# Patient Record
Sex: Male | Born: 1975 | Race: White | Hispanic: No | Marital: Single | Smoking: Current every day smoker
Health system: Southern US, Community
[De-identification: ages and names within clinical notes are randomized; demographics above are authoritative.]

## PROBLEM LIST (undated history)

## (undated) DIAGNOSIS — I219 Acute myocardial infarction, unspecified: Secondary | ICD-10-CM

## (undated) DIAGNOSIS — I10 Essential (primary) hypertension: Secondary | ICD-10-CM

## (undated) HISTORY — PX: CORONARY ANGIOPLASTY WITH STENT PLACEMENT: SHX49

---

## 1998-09-20 ENCOUNTER — Emergency Department (HOSPITAL_COMMUNITY): Admission: EM | Admit: 1998-09-20 | Discharge: 1998-09-20 | Payer: Self-pay | Admitting: Emergency Medicine

## 1999-10-06 ENCOUNTER — Emergency Department (HOSPITAL_COMMUNITY): Admission: EM | Admit: 1999-10-06 | Discharge: 1999-10-06 | Payer: Self-pay | Admitting: Emergency Medicine

## 2001-04-03 ENCOUNTER — Emergency Department (HOSPITAL_COMMUNITY): Admission: EM | Admit: 2001-04-03 | Discharge: 2001-04-03 | Payer: Self-pay | Admitting: Emergency Medicine

## 2017-11-19 ENCOUNTER — Other Ambulatory Visit: Payer: Self-pay | Admitting: Student

## 2017-11-19 DIAGNOSIS — H05012 Cellulitis of left orbit: Secondary | ICD-10-CM

## 2017-11-20 ENCOUNTER — Other Ambulatory Visit: Payer: Self-pay | Admitting: Student

## 2017-11-20 DIAGNOSIS — H05012 Cellulitis of left orbit: Secondary | ICD-10-CM

## 2017-11-21 ENCOUNTER — Ambulatory Visit: Admission: RE | Admit: 2017-11-21 | Payer: Self-pay | Source: Ambulatory Visit

## 2017-11-24 ENCOUNTER — Inpatient Hospital Stay (HOSPITAL_COMMUNITY)
Admission: EM | Admit: 2017-11-24 | Discharge: 2017-12-03 | DRG: 853 | Disposition: A | Discharge status: Home / Self Care | Payer: Self-pay | Attending: Internal Medicine | Admitting: Internal Medicine

## 2017-11-24 ENCOUNTER — Inpatient Hospital Stay (HOSPITAL_COMMUNITY)
Admission: EM | Disposition: A | Discharge status: Home / Self Care | Payer: Self-pay | Source: Home / Self Care | Attending: Pulmonary Disease

## 2017-11-24 ENCOUNTER — Encounter (HOSPITAL_COMMUNITY): Payer: Self-pay | Admitting: Certified Registered"

## 2017-11-24 ENCOUNTER — Inpatient Hospital Stay (HOSPITAL_COMMUNITY): Payer: Self-pay

## 2017-11-24 ENCOUNTER — Emergency Department (HOSPITAL_COMMUNITY): Payer: Self-pay

## 2017-11-24 ENCOUNTER — Encounter (HOSPITAL_COMMUNITY): Payer: Self-pay | Admitting: Radiology

## 2017-11-24 ENCOUNTER — Encounter (HOSPITAL_COMMUNITY)
Admission: EM | Disposition: A | Discharge status: Home / Self Care | Payer: Self-pay | Source: Home / Self Care | Attending: Pulmonary Disease

## 2017-11-24 ENCOUNTER — Inpatient Hospital Stay (HOSPITAL_COMMUNITY): Payer: Self-pay | Admitting: Anesthesiology

## 2017-11-24 DIAGNOSIS — Z4659 Encounter for fitting and adjustment of other gastrointestinal appliance and device: Secondary | ICD-10-CM

## 2017-11-24 DIAGNOSIS — D6489 Other specified anemias: Secondary | ICD-10-CM | POA: Diagnosis present

## 2017-11-24 DIAGNOSIS — G936 Cerebral edema: Secondary | ICD-10-CM | POA: Diagnosis present

## 2017-11-24 DIAGNOSIS — J869 Pyothorax without fistula: Secondary | ICD-10-CM | POA: Diagnosis present

## 2017-11-24 DIAGNOSIS — J9811 Atelectasis: Secondary | ICD-10-CM | POA: Diagnosis present

## 2017-11-24 DIAGNOSIS — G935 Compression of brain: Secondary | ICD-10-CM | POA: Diagnosis present

## 2017-11-24 DIAGNOSIS — E872 Acidosis: Secondary | ICD-10-CM | POA: Diagnosis present

## 2017-11-24 DIAGNOSIS — R6521 Severe sepsis with septic shock: Secondary | ICD-10-CM | POA: Diagnosis present

## 2017-11-24 DIAGNOSIS — Z820 Family history of epilepsy and other diseases of the nervous system: Secondary | ICD-10-CM

## 2017-11-24 DIAGNOSIS — Z9911 Dependence on respirator [ventilator] status: Secondary | ICD-10-CM

## 2017-11-24 DIAGNOSIS — J9601 Acute respiratory failure with hypoxia: Secondary | ICD-10-CM

## 2017-11-24 DIAGNOSIS — J9600 Acute respiratory failure, unspecified whether with hypoxia or hypercapnia: Secondary | ICD-10-CM

## 2017-11-24 DIAGNOSIS — F17210 Nicotine dependence, cigarettes, uncomplicated: Secondary | ICD-10-CM | POA: Diagnosis present

## 2017-11-24 DIAGNOSIS — Z955 Presence of coronary angioplasty implant and graft: Secondary | ICD-10-CM

## 2017-11-24 DIAGNOSIS — A419 Sepsis, unspecified organism: Principal | ICD-10-CM | POA: Diagnosis present

## 2017-11-24 DIAGNOSIS — I2510 Atherosclerotic heart disease of native coronary artery without angina pectoris: Secondary | ICD-10-CM | POA: Diagnosis present

## 2017-11-24 DIAGNOSIS — Z7982 Long term (current) use of aspirin: Secondary | ICD-10-CM

## 2017-11-24 DIAGNOSIS — G060 Intracranial abscess and granuloma: Secondary | ICD-10-CM | POA: Diagnosis present

## 2017-11-24 DIAGNOSIS — I119 Hypertensive heart disease without heart failure: Secondary | ICD-10-CM | POA: Diagnosis present

## 2017-11-24 DIAGNOSIS — G062 Extradural and subdural abscess, unspecified: Secondary | ICD-10-CM

## 2017-11-24 DIAGNOSIS — Z8661 Personal history of infections of the central nervous system: Secondary | ICD-10-CM

## 2017-11-24 DIAGNOSIS — G40901 Epilepsy, unspecified, not intractable, with status epilepticus: Secondary | ICD-10-CM | POA: Diagnosis present

## 2017-11-24 DIAGNOSIS — J189 Pneumonia, unspecified organism: Secondary | ICD-10-CM

## 2017-11-24 DIAGNOSIS — E876 Hypokalemia: Secondary | ICD-10-CM | POA: Diagnosis present

## 2017-11-24 DIAGNOSIS — Z23 Encounter for immunization: Secondary | ICD-10-CM

## 2017-11-24 DIAGNOSIS — J321 Chronic frontal sinusitis: Secondary | ICD-10-CM | POA: Diagnosis present

## 2017-11-24 HISTORY — PX: CRANIOTOMY: SHX93

## 2017-11-24 LAB — COMPREHENSIVE METABOLIC PANEL
ALT: 24 U/L (ref 0–44)
AST: 40 U/L (ref 15–41)
Albumin: 3.8 g/dL (ref 3.5–5.0)
Alkaline Phosphatase: 84 U/L (ref 38–126)
Anion gap: 24 — ABNORMAL HIGH (ref 5–15)
BUN: 8 mg/dL (ref 6–20)
CALCIUM: 9 mg/dL (ref 8.9–10.3)
CO2: 8 mmol/L — ABNORMAL LOW (ref 22–32)
CREATININE: 1.28 mg/dL — AB (ref 0.61–1.24)
Chloride: 104 mmol/L (ref 98–111)
GFR calc non Af Amer: >60 mL/min (ref >60)
GLUCOSE: 309 mg/dL — AB (ref 70–99)
POTASSIUM: 5 mmol/L (ref 3.5–5.1)
SODIUM: 136 mmol/L (ref 135–145)
TOTAL PROTEIN: 7.7 g/dL (ref 6.5–8.1)
Total Bilirubin: 0.7 mg/dL (ref 0.3–1.2)

## 2017-11-24 LAB — I-STAT TROPONIN, ED: TROPONIN I, POC: 0 ng/mL (ref 0.00–0.08)

## 2017-11-24 LAB — URINALYSIS, ROUTINE W REFLEX MICROSCOPIC
BILIRUBIN URINE: NEGATIVE
Bacteria, UA: NONE SEEN
Glucose, UA: >=500 mg/dL — AB
Ketones, ur: NEGATIVE mg/dL
Leukocytes, UA: NEGATIVE
NITRITE: NEGATIVE
PH: 5 (ref 5.0–8.0)
Protein, ur: 100 mg/dL — AB
SPECIFIC GRAVITY, URINE: 1.012 (ref 1.005–1.030)

## 2017-11-24 LAB — CBC WITH DIFFERENTIAL/PLATELET
ABS IMMATURE GRANULOCYTES: 0.63 10*3/uL — AB (ref 0.00–0.07)
Basophils Absolute: 0.1 10*3/uL (ref 0.0–0.1)
Basophils Relative: 0 %
EOS PCT: 1 %
Eosinophils Absolute: 0.2 10*3/uL (ref 0.0–0.5)
HCT: 48.3 % (ref 39.0–52.0)
Hemoglobin: 15.1 g/dL (ref 13.0–17.0)
Immature Granulocytes: 4 %
LYMPHS ABS: 2.4 10*3/uL (ref 0.7–4.0)
LYMPHS PCT: 14 %
MCH: 28.9 pg (ref 26.0–34.0)
MCHC: 31.3 g/dL (ref 30.0–36.0)
MCV: 92.4 fL (ref 80.0–100.0)
Monocytes Absolute: 0.6 10*3/uL (ref 0.1–1.0)
Monocytes Relative: 3 %
NRBC: 0 % (ref 0.0–0.2)
Neutro Abs: 13 10*3/uL — ABNORMAL HIGH (ref 1.7–7.7)
Neutrophils Relative %: 78 %
Platelets: 565 10*3/uL — ABNORMAL HIGH (ref 150–400)
RBC: 5.23 MIL/uL (ref 4.22–5.81)
RDW: 13.6 % (ref 11.5–15.5)
WBC: 16.9 10*3/uL — ABNORMAL HIGH (ref 4.0–10.5)

## 2017-11-24 LAB — I-STAT ARTERIAL BLOOD GAS, ED
Acid-base deficit: 6 mmol/L — ABNORMAL HIGH (ref 0.0–2.0)
Bicarbonate: 19.9 mmol/L — ABNORMAL LOW (ref 20.0–28.0)
O2 SAT: 100 %
TCO2: 21 mmol/L — AB (ref 22–32)
pCO2 arterial: 41.3 mmHg (ref 32.0–48.0)
pH, Arterial: 7.291 — ABNORMAL LOW (ref 7.350–7.450)
pO2, Arterial: 253 mmHg — ABNORMAL HIGH (ref 83.0–108.0)

## 2017-11-24 LAB — RAPID URINE DRUG SCREEN, HOSP PERFORMED
AMPHETAMINES: NOT DETECTED
BARBITURATES: NOT DETECTED
Benzodiazepines: POSITIVE — AB
Cocaine: NOT DETECTED
Opiates: NOT DETECTED
TETRAHYDROCANNABINOL: POSITIVE — AB

## 2017-11-24 LAB — ETHANOL: Alcohol, Ethyl (B): <10 mg/dL (ref <10)

## 2017-11-24 LAB — TROPONIN I: Troponin I: 0.06 ng/mL (ref <0.03)

## 2017-11-24 LAB — LACTIC ACID, PLASMA
Lactic Acid, Venous: 2.9 mmol/L (ref 0.5–1.9)
Lactic Acid, Venous: 2.8 mmol/L (ref 0.5–1.9)

## 2017-11-24 LAB — MRSA PCR SCREENING: MRSA BY PCR: NEGATIVE

## 2017-11-24 LAB — AMMONIA: AMMONIA: 152 umol/L — AB (ref 9–35)

## 2017-11-24 SURGERY — CRANIOTOMY HEMATOMA EVACUATION SUBDURAL
Anesthesia: General | Laterality: Bilateral

## 2017-11-24 SURGERY — CRANIOTOMY HEMATOMA EVACUATION SUBDURAL
Anesthesia: General | Site: Head | Laterality: Bilateral

## 2017-11-24 MED ORDER — VANCOMYCIN HCL 10 G IV SOLR
2000.0000 mg | Freq: Once | INTRAVENOUS | Status: AC
  Administered 2017-11-24: 2000 mg via INTRAVENOUS
  Filled 2017-11-24: qty 2000

## 2017-11-24 MED ORDER — LORAZEPAM 2 MG/ML IJ SOLN
INTRAMUSCULAR | Status: AC
  Administered 2017-11-24: 2 mg
  Filled 2017-11-24: qty 1

## 2017-11-24 MED ORDER — THROMBIN 5000 UNITS EX SOLR
CUTANEOUS | Status: AC
  Filled 2017-11-24: qty 5000

## 2017-11-24 MED ORDER — FENTANYL 2500MCG IN NS 250ML (10MCG/ML) PREMIX INFUSION
10.0000 ug/h | INTRAVENOUS | Status: DC
  Filled 2017-11-24 (×2): qty 250

## 2017-11-24 MED ORDER — IOPAMIDOL (ISOVUE-370) INJECTION 76%
INTRAVENOUS | Status: AC
  Administered 2017-11-24: 50 mL
  Filled 2017-11-24: qty 100

## 2017-11-24 MED ORDER — ARTIFICIAL TEARS OPHTHALMIC OINT
TOPICAL_OINTMENT | OPHTHALMIC | Status: AC
  Filled 2017-11-24: qty 3.5

## 2017-11-24 MED ORDER — FENTANYL BOLUS VIA INFUSION
50.0000 ug | INTRAVENOUS | Status: DC | PRN
  Filled 2017-11-24: qty 50

## 2017-11-24 MED ORDER — SODIUM CHLORIDE 0.9 % IV SOLN
10.0000 mg/h | INTRAVENOUS | Status: DC
  Administered 2017-11-24: 30 mg/h via INTRAVENOUS
  Administered 2017-11-25: 01:00:00 via INTRAVENOUS
  Administered 2017-11-26 (×2): 10 mg/h via INTRAVENOUS
  Filled 2017-11-24 (×5): qty 20

## 2017-11-24 MED ORDER — SODIUM CHLORIDE 0.9 % IV SOLN
2.0000 g | Freq: Once | INTRAVENOUS | Status: AC
  Administered 2017-11-24: 2 g via INTRAVENOUS
  Filled 2017-11-24: qty 20

## 2017-11-24 MED ORDER — ROCURONIUM BROMIDE 50 MG/5ML IV SOSY
PREFILLED_SYRINGE | INTRAVENOUS | Status: AC
  Filled 2017-11-24: qty 5

## 2017-11-24 MED ORDER — LORAZEPAM 2 MG/ML IJ SOLN
INTRAMUSCULAR | Status: AC
  Administered 2017-11-24: 17:00:00
  Filled 2017-11-24: qty 1

## 2017-11-24 MED ORDER — SODIUM CHLORIDE 0.9 % IV SOLN
0.5000 mg/h | INTRAVENOUS | Status: DC
  Administered 2017-11-24: 0.5 mg/h via INTRAVENOUS
  Administered 2017-11-24: 20 mg/h via INTRAVENOUS
  Administered 2017-11-24 (×2): 12.5 mg/h via INTRAVENOUS
  Filled 2017-11-24 (×2): qty 10

## 2017-11-24 MED ORDER — LACTATED RINGERS IV SOLN
INTRAVENOUS | Status: DC | PRN
  Administered 2017-11-24: 23:00:00 via INTRAVENOUS

## 2017-11-24 MED ORDER — LIDOCAINE-EPINEPHRINE 1 %-1:100000 IJ SOLN
INTRAMUSCULAR | Status: AC
  Filled 2017-11-24: qty 1

## 2017-11-24 MED ORDER — IOPAMIDOL (ISOVUE-370) INJECTION 76%
50.0000 mL | Freq: Once | INTRAVENOUS | Status: AC | PRN
  Administered 2017-11-24: 50 mL via INTRAVENOUS

## 2017-11-24 MED ORDER — LEVETIRACETAM IN NACL 1000 MG/100ML IV SOLN
1000.0000 mg | Freq: Two times a day (BID) | INTRAVENOUS | Status: DC
  Administered 2017-11-25: 1000 mg via INTRAVENOUS
  Filled 2017-11-24: qty 100

## 2017-11-24 MED ORDER — LORAZEPAM 2 MG/ML IJ SOLN
INTRAMUSCULAR | Status: AC
  Administered 2017-11-24: 16:00:00
  Filled 2017-11-24: qty 1

## 2017-11-24 MED ORDER — MIDAZOLAM BOLUS VIA INFUSION
10.0000 mg | Freq: Once | INTRAVENOUS | Status: AC
  Administered 2017-11-24: 10 mg via INTRAVENOUS
  Filled 2017-11-24: qty 10

## 2017-11-24 MED ORDER — VALPROATE SODIUM 500 MG/5ML IV SOLN
15.0000 mg/kg/d | Freq: Three times a day (TID) | INTRAVENOUS | Status: DC
  Administered 2017-11-25 (×3): 624 mg via INTRAVENOUS
  Filled 2017-11-24 (×5): qty 6.24

## 2017-11-24 MED ORDER — FENTANYL CITRATE (PF) 100 MCG/2ML IJ SOLN: 100.0000 ug | INTRAMUSCULAR | Status: DC | PRN

## 2017-11-24 MED ORDER — LACTATED RINGERS IV SOLN
INTRAVENOUS | Status: DC
  Administered 2017-11-24: 22:00:00 via INTRAVENOUS

## 2017-11-24 MED ORDER — BACITRACIN ZINC 500 UNIT/GM EX OINT
TOPICAL_OINTMENT | CUTANEOUS | Status: AC
  Filled 2017-11-24: qty 28.35

## 2017-11-24 MED ORDER — THROMBIN (RECOMBINANT) 20000 UNITS EX SOLR
CUTANEOUS | Status: AC
  Filled 2017-11-24: qty 20000

## 2017-11-24 MED ORDER — VALPROATE SODIUM 500 MG/5ML IV SOLN: 500.0000 mg | Freq: Once | INTRAVENOUS | Status: DC

## 2017-11-24 MED ORDER — VANCOMYCIN HCL IN DEXTROSE 1-5 GM/200ML-% IV SOLN
1000.0000 mg | Freq: Two times a day (BID) | INTRAVENOUS | Status: DC
  Administered 2017-11-25 – 2017-11-27 (×5): 1000 mg via INTRAVENOUS
  Filled 2017-11-24 (×5): qty 200

## 2017-11-24 MED ORDER — METRONIDAZOLE IN NACL 5-0.79 MG/ML-% IV SOLN
500.0000 mg | Freq: Once | INTRAVENOUS | Status: AC
  Administered 2017-11-24: 500 mg via INTRAVENOUS
  Filled 2017-11-24: qty 100

## 2017-11-24 MED ORDER — ROCURONIUM BROMIDE 50 MG/5ML IV SOSY
PREFILLED_SYRINGE | INTRAVENOUS | Status: DC | PRN
  Administered 2017-11-24 – 2017-11-25 (×3): 50 mg via INTRAVENOUS
  Administered 2017-11-25: 100 mg via INTRAVENOUS
  Administered 2017-11-25: 50 mg via INTRAVENOUS

## 2017-11-24 MED ORDER — FENTANYL 2500MCG IN NS 250ML (10MCG/ML) PREMIX INFUSION
25.0000 ug/h | INTRAVENOUS | Status: DC
  Administered 2017-11-24: 25 ug/h via INTRAVENOUS
  Administered 2017-11-25: 100 ug/h via INTRAVENOUS
  Administered 2017-11-26 (×3): 250 ug/h via INTRAVENOUS
  Administered 2017-11-27 (×2): 200 ug/h via INTRAVENOUS
  Filled 2017-11-24 (×5): qty 250

## 2017-11-24 MED ORDER — FAMOTIDINE IN NACL 20-0.9 MG/50ML-% IV SOLN
20.0000 mg | Freq: Two times a day (BID) | INTRAVENOUS | Status: DC
  Administered 2017-11-25: 20 mg via INTRAVENOUS
  Filled 2017-11-24: qty 50

## 2017-11-24 MED ORDER — FENTANYL CITRATE (PF) 100 MCG/2ML IJ SOLN
50.0000 ug | Freq: Once | INTRAMUSCULAR | Status: AC
  Administered 2017-11-24: 50 ug via INTRAVENOUS

## 2017-11-24 MED ORDER — VALPROATE SODIUM 500 MG/5ML IV SOLN
20.0000 mg/kg | Freq: Once | INTRAVENOUS | Status: AC
  Administered 2017-11-24: 2494 mg via INTRAVENOUS
  Filled 2017-11-24: qty 24.94

## 2017-11-24 MED ORDER — GADOBUTROL 1 MMOL/ML IV SOLN
12.0000 mL | Freq: Once | INTRAVENOUS | Status: AC | PRN
  Administered 2017-11-24: 12 mL via INTRAVENOUS

## 2017-11-24 MED ORDER — EPHEDRINE 5 MG/ML INJ
INTRAVENOUS | Status: AC
  Filled 2017-11-24: qty 10

## 2017-11-24 MED ORDER — PHENYLEPHRINE 40 MCG/ML (10ML) SYRINGE FOR IV PUSH (FOR BLOOD PRESSURE SUPPORT)
PREFILLED_SYRINGE | INTRAVENOUS | Status: AC
  Filled 2017-11-24: qty 10

## 2017-11-24 MED ORDER — SODIUM CHLORIDE 0.9 % IV SOLN
30.0000 mg/h | INTRAVENOUS | Status: DC
  Administered 2017-11-24: 30 mg/h via INTRAVENOUS
  Filled 2017-11-24 (×2): qty 10

## 2017-11-24 MED ORDER — SODIUM CHLORIDE 0.9 % IV SOLN
INTRAVENOUS | Status: DC
  Administered 2017-11-24 – 2017-11-26 (×2): via INTRAVENOUS

## 2017-11-24 MED ORDER — IOPAMIDOL (ISOVUE-370) INJECTION 76%: 100.0000 mL | Freq: Once | INTRAVENOUS | Status: DC | PRN

## 2017-11-24 MED ORDER — ENOXAPARIN SODIUM 40 MG/0.4ML ~~LOC~~ SOLN: 40.0000 mg | SUBCUTANEOUS | Status: DC

## 2017-11-24 MED ORDER — LIDOCAINE 2% (20 MG/ML) 5 ML SYRINGE
INTRAMUSCULAR | Status: AC
  Filled 2017-11-24: qty 5

## 2017-11-24 MED ORDER — MIDAZOLAM HCL 2 MG/2ML IJ SOLN: 2.0000 mg | Freq: Once | INTRAMUSCULAR | Status: DC

## 2017-11-24 SURGICAL SUPPLY — 62 items
BLADE 15 SAFETY STRL DISP (BLADE) ×2 IMPLANT
BLADE CLIPPER SURG (BLADE) ×3 IMPLANT
BUR ACORN 9.0 PRECISION (BURR) ×2 IMPLANT
BUR ACORN 9.0MM PRECISION (BURR) ×1
BUR MATCHSTICK NEURO 3.0 LAGG (BURR) IMPLANT
BUR SABER DIAMOND 5.0 (BURR) ×2 IMPLANT
BUR SPIRAL ROUTER 2.3 (BUR) ×2 IMPLANT
BUR SPIRAL ROUTER 2.3MM (BUR) ×1
CANISTER SUCT 3000ML PPV (MISCELLANEOUS) ×5 IMPLANT
CATH FOLEY 2WAY SLVR  5CC 14FR (CATHETERS) ×2
CATH FOLEY 2WAY SLVR 5CC 14FR (CATHETERS) IMPLANT
CLIP RANEY DISP (INSTRUMENTS) ×2 IMPLANT
COVER WAND RF STERILE (DRAPES) ×3 IMPLANT
DRAPE NEUROLOGICAL W/INCISE (DRAPES) ×3 IMPLANT
DRAPE SHEET LG 3/4 BI-LAMINATE (DRAPES) ×3 IMPLANT
DRAPE WARM FLUID 44X44 (DRAPE) ×3 IMPLANT
DURAPREP 6ML APPLICATOR 50/CS (WOUND CARE) ×3 IMPLANT
ELECT REM PT RETURN 9FT ADLT (ELECTROSURGICAL) ×3
ELECTRODE REM PT RTRN 9FT ADLT (ELECTROSURGICAL) ×1 IMPLANT
GAUZE 4X4 16PLY RFD (DISPOSABLE) IMPLANT
GAUZE SPONGE 4X4 12PLY STRL (GAUZE/BANDAGES/DRESSINGS) ×3 IMPLANT
GLOVE BIO SURGEON STRL SZ7.5 (GLOVE) ×6 IMPLANT
GLOVE BIOGEL PI IND STRL 7.0 (GLOVE) IMPLANT
GLOVE BIOGEL PI IND STRL 7.5 (GLOVE) ×2 IMPLANT
GLOVE BIOGEL PI INDICATOR 7.0 (GLOVE) ×6
GLOVE BIOGEL PI INDICATOR 7.5 (GLOVE) ×10
GOWN STRL REUS W/ TWL LRG LVL3 (GOWN DISPOSABLE) ×2 IMPLANT
GOWN STRL REUS W/ TWL XL LVL3 (GOWN DISPOSABLE) IMPLANT
GOWN STRL REUS W/TWL 2XL LVL3 (GOWN DISPOSABLE) ×2 IMPLANT
GOWN STRL REUS W/TWL LRG LVL3 (GOWN DISPOSABLE) ×6
GOWN STRL REUS W/TWL XL LVL3 (GOWN DISPOSABLE) ×6
GRAFT DURAGEN MATRIX 1WX1L (Tissue) ×2 IMPLANT
HEMOSTAT POWDER KIT SURGIFOAM (HEMOSTASIS) ×2 IMPLANT
HOOK DURA 1/2IN (MISCELLANEOUS) ×3 IMPLANT
KIT BASIN OR (CUSTOM PROCEDURE TRAY) ×3 IMPLANT
KIT TURNOVER KIT B (KITS) ×3 IMPLANT
NEEDLE HYPO 22GX1.5 SAFETY (NEEDLE) ×3 IMPLANT
NS IRRIG 1000ML POUR BTL (IV SOLUTION) ×3 IMPLANT
PACK CRANIOTOMY CUSTOM (CUSTOM PROCEDURE TRAY) ×3 IMPLANT
PLATE 1.5 6HOLE XLONG DBL Y (Plate) ×4 IMPLANT
PLATE 1.5/0.5 13MM BURR HOLE (Plate) ×4 IMPLANT
PLATE 1.5/0.5 18.5MM BURR HOLE (Plate) ×4 IMPLANT
SCREW SELF DRILL HT 1.5/4MM (Screw) ×48 IMPLANT
SEALANT ADHERUS EXTEND TIP (MISCELLANEOUS) ×2 IMPLANT
SPONGE LAP 18X18 RF (DISPOSABLE) ×2 IMPLANT
SPONGE SURGIFOAM ABS GEL 100 (HEMOSTASIS) ×3 IMPLANT
STAPLER VISISTAT 35W (STAPLE) ×3 IMPLANT
STOCKINETTE 6  STRL (DRAPES) ×2
STOCKINETTE 6 STRL (DRAPES) ×1 IMPLANT
SUT NURALON 4 0 TR CR/8 (SUTURE) ×7 IMPLANT
SUT STEEL 0 (SUTURE)
SUT STEEL 0 18XMFL TIE 17 (SUTURE) IMPLANT
SUT VIC AB 0 CT1 18XCR BRD8 (SUTURE) ×2 IMPLANT
SUT VIC AB 0 CT1 8-18 (SUTURE) ×6
SUT VIC AB 3-0 SH 8-18 (SUTURE) ×6 IMPLANT
TOWEL GREEN STERILE (TOWEL DISPOSABLE) ×3 IMPLANT
TOWEL GREEN STERILE FF (TOWEL DISPOSABLE) ×3 IMPLANT
TRAY FOLEY MTR SLVR 16FR STAT (SET/KITS/TRAYS/PACK) ×3 IMPLANT
TUBE CONNECTING 12'X1/4 (SUCTIONS) ×1
TUBE CONNECTING 12X1/4 (SUCTIONS) ×2 IMPLANT
UNDERPAD 30X30 (UNDERPADS AND DIAPERS) ×3 IMPLANT
WATER STERILE IRR 1000ML POUR (IV SOLUTION) ×3 IMPLANT

## 2017-11-24 NOTE — Anesthesia Procedure Notes (Signed)
Arterial Line Insertion Start/End10/19/2019 11:40 PM, 11/24/2017 11:43 PM Performed by: Shelton Silvas, MD, anesthesiologist  Patient location: Pre-op. Preanesthetic checklist: patient identified, IV checked, site marked, risks and benefits discussed, surgical consent, monitors and equipment checked, pre-op evaluation, timeout performed and anesthesia consent Lidocaine 1% used for infiltration Left, radial was placed Catheter size: 20 Fr Hand hygiene performed  and maximum sterile barriers used   Attempts: 1 Procedure performed without using ultrasound guided technique. Following insertion, dressing applied. Post procedure assessment: normal and unchanged  Patient tolerated the procedure well with no immediate complications.

## 2017-11-24 NOTE — ED Notes (Signed)
Returned from CT.

## 2017-11-24 NOTE — Consult Note (Signed)
Neurosurgery Consultation  Reason for Consult: Subdural empyema Referring Physician: Mannam  CC: Status epilepticus  HPI: This is a 42 y.o. man that presents with status epilepticus. He recently had surgery at [facility] for what sounds like a sinus infection and periorbital cellulitis with what is described as suborbital abscess. 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 There was concern he needed to have another I&D, but the patient was unable to have it performed. Interim history since then is unavailable due to patient's depressed mental status. But he presented to the ED with status epilepticus, sounds like a combination of EPC and GTCs s/p intubation.    ROS: A 14 point ROS was performed and is negative except as noted in the HPI but limited due to patient's depressed mental status  PMHx: History reviewed. No pertinent past medical history. FamHx: History reviewed. No pertinent family history. SocHx:  has no tobacco, alcohol, and drug history on file.  Exam: Vital signs in last 24 hours: Temp:  [98 F (36.7 C)] 98 F (36.7 C) (10/19 1456) Pulse Rate:  [101-148] 101 (10/19 1815) Resp:  [14-27] 24 (10/19 1815) BP: (105-229)/(68-92) 113/76 (10/19 1815) SpO2:  [90 %-98 %] 94 % (10/19 1815) FiO2 (%):  [40 %-100 %] 40 % (10/19 1735) Weight:  [124.7 kg] 124.7 kg (10/19 1543) General: Intubated in ED bed, appears acutely ill Head: normocephalic and atruamatic HEENT: neck supple Pulmonary: intubated, good chest rise bilaterally Cardiac: tachycardic, pulse regular  Abdomen: obese S NT ND Extremities: warm and well perfused x4 Neuro: PERRL, right gaze deviation, +c/c/g, some purposeful movement with the RUE, none on the left   Assessment and Plan: 42 y.o. man with h/o orbital cellulitis and possibly abscess with purulent sinusitis s/p drainage, now presenting with status epilepticus. CTH personally reviewed, which shows bifrontal collections  possibly consistent with subdural empyema. Exam consistent with right hemispheric status epilepticus except for gaze deviation, which is a little concerning. ED currently treating status with AED load and midazolam gtt. -stat MRI brain w/wo to confirm diagnosis and evaluate for intraparenchymal abscess. If empyemas present, will require surgical drainage and skull base reconstruction  -CT face to evaluate post-surgical bony anatomy from prior surgery and locate area of skull base defect -discussed above with patient's daughter including the severity of his acute illness. She inquired regarding his neurologic outcome and I emphasized that it is too early to tell, especially before the MRI which could already have signs of infarct. She said that the patient would certainly want to proceed with surgery and consented on his behalf via telephone.  -will review MRI when completed and, if confirmatory, take the patient to the OR tonight for drainage and repair  Jadene Pierini, MD 11/24/17 7:31 PM [facility]

## 2017-11-24 NOTE — ED Provider Notes (Signed)
[facility] EMERGENCY DEPARTMENT Provider Note   CSN: [id number] Arrival date & time: 11/24/17  1425     History   Chief Complaint Chief Complaint  Patient presents with  . Seizures  . Altered Mental Status    HPI Ryan Bryan is a 42 y.o. male.  Patient is a 42 year old male presenting today by EMS for new onset seizures.  Patient was with his relative and was fine this morning with no complaints.  He was outside smoking a cigarette when he told the relative something about having back pain but then fell to the ground and started seizing repeatedly.  They stated he sees for maybe 20 or 30 minutes before EMS arrived.  When paramedics arrived patient was still seizing full body tonic-clonic movements and was nonresponsive.  He received 2.5 mg of Versed with cessation of seizures but he never regained consciousness.  Patient is unable to answer any questions here but later family arrived and states he has a history of cardiac disease status post stents he does drink intermittently but has never gone through withdrawal from not drinking alcohol.  He used to use cocaine heavily but his daughter states in the last 6 months he had not been using as far she knows.  Last week he did go to [facility] and had a surgery on his eye and she saw him on Thursday and he was doing much better.  He has never had seizures as far she knows but they do run in his family and his sister has a seizure disorder.  There is no history of liver disease but his daughter said he did have some injury to his kidney when he went for the surgery because he was taking so many Goody[employer] powders and ibuprofen for the pain in his eye.  The history is provided by a relative and the EMS personnel.    No past medical history on file.  There are no active problems to display for this patient.         Home Medications    Prior to Admission medications   Not on File    Family History No family history on  file.  Social History Social History   Tobacco Use  . Smoking status: Not on file  Substance Use Topics  . Alcohol use: Not on file  . Drug use: Not on file     Allergies   Patient has no allergy information on record.   Review of Systems Review of Systems  Unable to perform ROS: Acuity of condition     Physical Exam Updated Vital Signs BP (!) 194/90   Pulse (!) 131   Temp 98 F (36.7 C) (Axillary)   Resp 18   Wt 124.7 kg   SpO2 97%   Physical Exam  Constitutional: He is oriented to person, place, and time. He appears well-developed and well-nourished. He appears distressed.  HENT:  Head: Normocephalic and atraumatic.  Mouth/Throat: Oropharynx is clear and moist.  Dentures that were removed.  Bleeding from the mouth.  Mild bruising and swelling over the left eye  Eyes: Pupils are equal, round, and reactive to light. Conjunctivae and EOM are normal.  Neck: Normal range of motion. Neck supple.  Cardiovascular: Regular rhythm and intact distal pulses. Tachycardia present.  No murmur heard. Pulmonary/Chest: Effort normal and breath sounds normal. No respiratory distress. He has no wheezes. He has no rales.  Sonorous breathing throughout  Abdominal: Soft. He exhibits no distension. There  is no tenderness. There is no rebound and no guarding.  Musculoskeletal: Normal range of motion. He exhibits no edema or tenderness.  Chronic injury to the right hand with contracture and muscle wasting  Neurological: He is alert and oriented to person, place, and time.  Patient is currently postictal upon arrival.  He has roving eye movements but will not follow commands.  He was witnessed to be moving all of his extremities at some point.  There was a brief moment with some purposeful movement when he tried to pull the nonrebreather off.  Patient had multiple 2 to 3 minutes seizures while in the emergency room.  He never regained consciousness.  Skin: Skin is warm. Capillary refill  takes less than 2 seconds. No rash noted. He is diaphoretic. No erythema.  Psychiatric:  Pt is unresponsive  Nursing note and vitals reviewed.    ED Treatments / Results  Labs (all labs ordered are listed, but only abnormal results are displayed) Labs Reviewed  CBC WITH DIFFERENTIAL/PLATELET - Abnormal; Notable for the following components:      Result Value   WBC 16.9 (*)    Platelets 565 (*)    Neutro Abs 13.0 (*)    Abs Immature Granulocytes 0.63 (*)    All other components within normal limits  COMPREHENSIVE METABOLIC PANEL - Abnormal; Notable for the following components:   CO2 8 (*)    Glucose, Bld 309 (*)    Creatinine, Ser 1.28 (*)    Anion gap 24 (*)    All other components within normal limits  AMMONIA - Abnormal; Notable for the following components:   Ammonia 152 (*)    All other components within normal limits  ETHANOL  RAPID URINE DRUG SCREEN, HOSP PERFORMED  URINALYSIS, ROUTINE W REFLEX MICROSCOPIC  I-STAT TROPONIN, ED    EKG EKG Interpretation  Date/Time:  Saturday November 24 2017 14:35:28 EDT Ventricular Rate:  125 PR Interval:    QRS Duration: 90 QT Interval:  303 QTC Calculation: 437 R Axis:   31 Text Interpretation:  Sinus tachycardia No previous tracing Confirmed by Gwyneth Sprout ([id number]) on 11/24/2017 3:15:03 PM   Radiology Dg Chest Port 1 View  Result Date: 11/24/2017 CLINICAL DATA:  Status post intubation today. Patient unresponsive. Seizures. EXAM: PORTABLE CHEST 1 VIEW COMPARISON:  None. FINDINGS: New endotracheal tube is in place with the tip at the level of the clavicular heads, 6 cm above the carina. Defibrillator pad is noted. Lungs are clear. Heart size is normal. No pneumothorax or pleural fluid. IMPRESSION: ET tube in good position. Lungs clear. Electronically Signed   By: Drusilla Kanner M.D.   On: 11/24/2017 16:10    Procedures Date/Time: 11/24/2017 4:10 PM Performed by: Gwyneth Sprout, MD Pre-anesthesia Checklist:  Patient identified, Emergency Drugs available, Suction available, Patient being monitored and Timeout performed Oxygen Delivery Method: Ambu bag Preoxygenation: Pre-oxygenation with 100% oxygen Induction Type: Rapid sequence Ventilation: Mask ventilation without difficulty Laryngoscope Size: Glidescope and 4 Grade View: Grade II Tube size: 8.0 mm Number of attempts: 1 Airway Equipment and Method: Patient positioned with wedge pillow Placement Confirmation: ETT inserted through vocal cords under direct vision,  Positive ETCO2,  CO2 detector and Breath sounds checked- equal and bilateral Secured at: 24 cm Tube secured with: ETT holder Dental Injury: Teeth and Oropharynx as per pre-operative assessment  Difficulty Due To: Difficulty was unanticipated      (including critical care time)  Medications Ordered in ED Medications  0.9 %  sodium chloride  infusion ( Intravenous New Bag/Given 11/24/17 1458)  LORazepam (ATIVAN) 2 MG/ML injection (has no administration in time range)  midazolam (VERSED) 50 mg in sodium chloride 0.9 % 50 mL (1 mg/mL) infusion (12.5 mg/hr Intravenous Bolus 11/24/17 1554)  fentaNYL in NS (79mcg/ml) infusion-PREMIX (has no administration in time range)  fentaNYL in NS (101mcg/ml) infusion-PREMIX (25 mcg/hr Intravenous New Bag/Given 11/24/17 1516)  fentaNYL (SUBLIMAZE) bolus via infusion 50 mcg (has no administration in time range)  LORazepam (ATIVAN) 2 MG/ML injection (has no administration in time range)  midazolam (VERSED) injection 2 mg (has no administration in time range)  valproate (DEPACON) 2,494 mg in dextrose 5 % 50 mL IVPB (has no administration in time range)  valproate (DEPACON) 624 mg in dextrose 5 % 50 mL IVPB (has no administration in time range)  iopamidol (ISOVUE-370) 76 % injection (has no administration in time range)  LORazepam (ATIVAN) 2 MG/ML injection (2 mg  Given 11/24/17 1435)  fentaNYL (SUBLIMAZE) injection 50 mcg  (50 mcg Intravenous Given 11/24/17 1517)  LORazepam (ATIVAN) 2 MG/ML injection (2 mg  Given 11/24/17 1539)     Initial Impression / Assessment and Plan / ED Course  I have reviewed the triage vital signs and the nursing notes.  Pertinent labs & imaging results that were available during my care of the patient were reviewed by me and considered in my medical decision making (see chart for details).     Patient with multiple medical problems presenting today with new onset seizure.  Patient had multiple seizures at home, and right out and upon arrival to the emergency room.  He received Versed in route with cessation of the seizures but then had a grand mal seizing here requiring a total of 6 mg of Ativan.  Patient was intubated for airway protection as he was not regaining consciousness in between.  He is currently in status epilepticus.  Patient received a gram of Keppra.  Spoke with neurology who recommended 12.5 mg of Versed and placing him on a 12.5 mg/h Versed drip.  Also patient was given Depakote.  Given his heart history, complaint of back pain before this started and decreased pulse in the right arm and leg we will do a CT to rule out dissection.  Also will do a head CT to rule out acute abnormality from the recent eye surgery.  Patient EKG is nonischemic.  He was given IV fluids.  Heart rate improved.  Blood pressure is hypertensive when seizing but then improves.  Patient's CBC with leukocytosis of 16,000, troponin is negative, CMP with normal LFTs and renal function of 1.28.  Patient's potassium is normal.  Ammonia is elevated but may be related to hemolysis and will recheck.  Call level is normal.  Tube is in appropriate placement.  Imaging is pending.  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  This is resulting from the recent surgery he had of his orbits last week.  Findings discussed with neurosurgery who will come and consult on the patient.   Patient was started on vancomycin, Rocephin and Flagyl per ID recommendation.  CRITICAL CARE Performed by: Tayley Mudrick Total critical care time: 40 minutes Critical care time was exclusive of separately billable procedures and treating other patients. Critical care was necessary to treat or prevent imminent or life-threatening deterioration. Critical care was time spent personally by me on the following activities: development of treatment plan with patient and/or surrogate as well as  nursing, discussions with consultants, evaluation of patient's response to treatment, examination of patient, obtaining history from patient or surrogate, ordering and performing treatments and interventions, ordering and review of laboratory studies, ordering and review of radiographic studies, pulse oximetry and re-evaluation of patient's condition.   Final Clinical Impressions(s) / ED Diagnoses   Final diagnoses:  Status epilepticus [facility])  Brain abscess    ED Discharge Orders    None       Gwyneth Sprout, MD 11/24/17 1742

## 2017-11-24 NOTE — Code Documentation (Signed)
Pt arrives by [facility] with unresponsiveness & reports of 2 seizures en route and HTN. No hx of seizure. Pt arrives on NRB- had another seizure in ED room lasting approx 2 minutes. 2mg  ativan IV. Ems gave 2.5mg  versed. Keppra started IV. Preparing to intubate.

## 2017-11-24 NOTE — Progress Notes (Signed)
Pharmacy Antibiotic Note  Ryan Bryan is a 42 y.o. male admitted on 11/24/2017 with brain abscess.  Pharmacy has been consulted for vancomycin dosing.  CT head finding 3 separate subdural empyemas with marked adjacent vasogenic edema with improved extraconal abscess. WBC 16.9, afebrile. Scr 1.28 (normCrCl 77 mL/min). Receiving metronidazole and ceftriaxone.   Plan: Vancomycin 2g IV once Vancomycin 1000 mg  IV every 12 hours.  Goal trough 15-20 mcg/mL.  Monitor renal fx, clinical pic, and cx results  Height: 6\' 1"  (185.4 cm) Weight: 275 lb (124.7 kg) IBW/kg (Calculated) : 79.9  Temp (24hrs), Avg:98 F (36.7 C), Min:98 F (36.7 C), Max:98 F (36.7 C)  Recent Labs  Lab 11/24/17 1440  WBC 16.9*  CREATININE 1.28*    Estimated Creatinine Clearance: 104 mL/min (A) (by C-G formula based on SCr of 1.28 mg/dL (H)).    No Known Allergies  Antimicrobials this admission: Vancomycin 10/19 >>  Metronidazole 10/19 >>  Ceftriaxone 10/19>>  Dose adjustments this admission: N/A  Microbiology results: Bcx 10/19: sent Ucx 10/19: sent Sputum 10/19: sent  Thank you for allowing pharmacy to be a part of this patient's care.  Girard Cooter, PharmD Clinical Pharmacist  Pager: [phone] Phone: [phone] 11/24/2017 6:21 PM

## 2017-11-24 NOTE — H&P (Addendum)
NAME:  Ryan Bryan, MRN:  [id number], DOB:  1975-04-25, LOS: 0 ADMISSION DATE:  11/24/2017, CONSULTATION DATE:  11/24/17 REFERRING MD: Salley Scarlet, EDP, CHIEF COMPLAINT: Status epilepticus  Brief History   42 year old with history of prior cocaine use, cardiac stent, recent eye surgery, sinus surgery for subperiorbital cellulitis at [facility] admitted with status epilepticus secondary to subdural empyema                              Past Medical History  Drug use, coronary artery disease s/p stent, periorbital cellulitis  Significant Hospital Events   10/2- 10/5- Admit to [facility] for left subperiorbital abscess. S/p FESS and orbitotomy by ENT and opthal. Notes in care everywhere  10/19- Admit to [facility] with status epilepticus, intubated in ED  Consults: date of consult/date signed off & final recs:  Neurology 10/19- Neurosurgery 10/19- ID 10/19-  Procedures (surgical and bedside):  ETT 10/19 >  Significant Diagnostic Tests:  CT head 10/19- subdural empyema is in the anterior cranial fossa, improved extra conal abscesses in left superior orbit, interval endoscopic sinus surgery, sinusitis.  CTA chest, abd, pelvis 10/19-basilar atelectasis, coronary artery disease in the left anterior descending artery.  I have reviewed the images personally.  Abscess Cx ([facility]) 11/09/17- Streptococcus constellatus (viridans Streptococcus) (A)  Micro Data:  Blood cultures 11/24/2017-  Antimicrobials:  Vanco 10/19 >> Flagyl 10/19 >> Rocephin 10/19 >>  Subjective:    Objective   Blood pressure 105/68, pulse (!) 127, temperature 98 F (36.7 C), temperature source Axillary, resp. rate (!) 27, height 6\' 1"  (1.854 m), weight 124.7 kg, SpO2 95 %.    Vent Mode: PRVC FiO2 (%):  [100 %] 100 % Set Rate:  [18 bmp] 18 bmp Vt Set:  [650 mL] 650 mL PEEP:  [5 cmH20] 5 cmH20  No intake or output data in the 24 hours ending 11/24/17 1736 Filed Weights   11/24/17 1543  Weight: 124.7 kg     Examination: Blood pressure 105/68, pulse (!) 127, temperature 98 F (36.7 C), temperature source Axillary, resp. rate (!) 27, height 6\' 1"  (1.854 m), weight 124.7 kg, SpO2 95 %. Gen:      No acute distress, well-developed male HEENT:  EOMI, sclera anicteric, ET tube Neck:     No masses; no thyromegaly Lungs:    Clear to auscultation bilaterally; normal respiratory effort CV:         Regular rate and rhythm; no murmurs Abd:      + bowel sounds; soft, non-tender; no palpable masses, no distension Ext:    No edema; adequate peripheral perfusion Skin:      Warm and dry; no rash Neuro: Sedated, intubated, rhythmic movements of the right arm, pupils reactive.  Resolved Hospital Problem list     Assessment & Plan:  Status epilepticus Continue Versed drip, Keppra Start valproate per neurology. EEG monitoring.  Subdural empyema after recent eye, sinus surgery Strep constellatus infection at [facility] neurosurgery and ID Likely to OR today Vanco, Flagyl, Rocephin per ID recommendation Blood cultures X 2  Respiratory failure secondary to seizures Continue full vent support Follow chest x-ray, ABG  Metabolic acidosis, likely elevated lactic acid from seizures Follow labs  Coronary artery disease status post stent Telemetry monitoring, check EKG, troponin Holding outpt ASA, Lopressor, losartan and gemfibrozil  Disposition / Summary of Today's Plan 11/24/17   Admit to the ICU for subdural abscess, empyema and status epilepticus Discussed with  neurosurgery and ID We will start broad antibiotic coverage, get MRI head and will likely go to the OR for evacuation.    Diet: NPO Pain/Anxiety/Delirium protocol (if indicated): Versed gtt, fentanyl PRN DVT prophylaxis: Holding lovenox in anticipation of surgery, SCDs GI prophylaxis: Pepcid Hyperglycemia protocol: Monitor sugars Mobility: Bed Code Status: Full Family Communication: Daughter updated at bedside  Labs    CBC: Recent Labs  Lab 11/24/17 1440  WBC 16.9*  NEUTROABS 13.0*  HGB 15.1  HCT 48.3  MCV 92.4  PLT 565*    Basic Metabolic Panel: Recent Labs  Lab 11/24/17 1440  NA 136  K 5.0  CL 104  CO2 8*  GLUCOSE 309*  BUN 8  CREATININE 1.28*  CALCIUM 9.0   GFR: Estimated Creatinine Clearance: 104 mL/min (A) (by C-G formula based on SCr of 1.28 mg/dL (H)). Recent Labs  Lab 11/24/17 1440  WBC 16.9*    Liver Function Tests: Recent Labs  Lab 11/24/17 1440  AST 40  ALT 24  ALKPHOS 84  BILITOT 0.7  PROT 7.7  ALBUMIN 3.8   No results for input(s): LIPASE, AMYLASE in the last 168 hours. Recent Labs  Lab 11/24/17 1440  AMMONIA 152*    ABG    Component Value Date/Time   PHART 7.291 (L) 11/24/2017 1727   PCO2ART 41.3 11/24/2017 1727   PO2ART 253.0 (H) 11/24/2017 1727   HCO3 19.9 (L) 11/24/2017 1727   TCO2 21 (L) 11/24/2017 1727   ACIDBASEDEF 6.0 (H) 11/24/2017 1727   O2SAT 100.0 11/24/2017 1727     Coagulation Profile: No results for input(s): INR, PROTIME in the last 168 hours.  Cardiac Enzymes: No results for input(s): CKTOTAL, CKMB, CKMBINDEX, TROPONINI in the last 168 hours.  HbA1C: No results found for: HGBA1C  CBG: No results for input(s): GLUCAP in the last 168 hours.  Admitting History of Present Illness.   42 year old admitted with new onset seizures. Was outside smoking a cigarette when he complained of back pain and fell to the ground with seizure activity for about 20 to 30 minutes.  Given Versed and brought to the ED where he was intubated and PCCM called for admission  Notes that he had a recent surgery of eye at [facility].  No history of seizures.  Reported cocaine use but has not done so in the past 6 months.  He is an occasional drinker, active tobacco smoker.  Review of Systems:   Unable to obtain  Past Medical History  He,  has no past medical history on file.   Surgical History   History reviewed. No pertinent surgical history.    Social History   Social History   Socioeconomic History  . Marital status: Single    Spouse name: Not on file  . Number of children: Not on file  . Years of education: Not on file  . Highest education level: Not on file  Occupational History  . Not on file  Social Needs  . Financial resource strain: Not on file  . Food insecurity:    Worry: Not on file    Inability: Not on file  . Transportation needs:    Medical: Not on file    Non-medical: Not on file  Tobacco Use  . Smoking status: Not on file  Substance and Sexual Activity  . Alcohol use: Not on file  . Drug use: Not on file  . Sexual activity: Not on file  Lifestyle  . Physical activity:    Days per week:  Not on file    Minutes per session: Not on file  . Stress: Not on file  Relationships  . Social connections:    Talks on phone: Not on file    Gets together: Not on file    Attends religious service: Not on file    Active member of club or organization: Not on file    Attends meetings of clubs or organizations: Not on file    Relationship status: Not on file  . Intimate partner violence:    Fear of current or ex partner: Not on file    Emotionally abused: Not on file    Physically abused: Not on file    Forced sexual activity: Not on file  Other Topics Concern  . Not on file  Social History Narrative  . Not on file  ,     Family History   His family history is not on file.   Allergies Not on File   Home Medications  Prior to Admission medications   Not on File     Critical care time: 55     The patient is critically ill with multiple organ system failure and requires high complexity decision making for assessment and support, frequent evaluation and titration of therapies, advanced monitoring, review of radiographic studies and interpretation of complex data.   Critical Care Time devoted to patient care services, exclusive of separately billable procedures, described in this note is 35  minutes.   Chilton Greathouse MD [facility] Pager [phone] If no answer or after 3pm call: [phone] 11/24/2017, 5:53 PM

## 2017-11-24 NOTE — Anesthesia Preprocedure Evaluation (Signed)
Anesthesia Evaluation  Patient identified by MRN, date of birth, ID band  Reviewed: Unable to perform ROS - Chart review onlyPreop documentation limited or incomplete due to emergent nature of procedure.  Airway Mallampati: Intubated       Dental  (+) Teeth Intact   Pulmonary    breath sounds clear to auscultation       Cardiovascular  Rhythm:Regular Rate:Normal     Neuro/Psych    GI/Hepatic   Endo/Other    Renal/GU      Musculoskeletal   Abdominal (+) + obese,   Peds  Hematology   Anesthesia Other Findings   Reproductive/Obstetrics                             Anesthesia Physical Anesthesia Plan  ASA: III and emergent  Anesthesia Plan: General   Post-op Pain Management:    Induction: Inhalational  PONV Risk Score and Plan: 2 and Treatment may vary due to age or medical condition  Airway Management Planned: Oral ETT  Additional Equipment: Arterial line  Intra-op Plan:   Post-operative Plan: Post-operative intubation/ventilation  Informed Consent:   Only emergency history available and History available from chart only  Plan Discussed with: CRNA  Anesthesia Plan Comments:         Anesthesia Quick Evaluation

## 2017-11-24 NOTE — ED Notes (Signed)
Immediately after intubation- pt brady down into 30's. Pt placed on Zoll HR back into 90s

## 2017-11-24 NOTE — Consult Note (Signed)
NEURO HOSPITALIST CONSULT NOTE   Requesting physician: Dr. Anitra Lauth  Reason for Consult: New onset seizure with status epilepticus  History obtained from:  EDP and Chart and family   HPI:                                                                                                                                          Ryan Bryan is an 42 y.o. male  With  PMH significant for recent surgical treatment of a left intraorbital abscess at [facility], who presented to the [facility] ED with AMS and first time seizure evolving to status epilepticus.   Per family/ Chart the patient does not have a prior seizure history. He was recovering well from his recent left orbital surgery at [facility] until earlier last week, when the headache which had been improving following surgery started to worsen. He was conversant and behaving normally despite recurrence of eye swelling, eye pain and headache today per family, while staying at his sister's house, until about 2:30 pm when his sister found him collapsed outside in the yard. At that time he could not move but could nod his head. Then he had a seizure. EMS was called. Family states that patient was seizing for 20 or 30 minutes before EMS arrival. When EMS arrived they witnessed a full body tonic clonic seizure for which they gave 2.5 mg of Versed. Seizure stopped at that time. Patient arrived to ED on a NRB and proceeded to seize again for about 2 minutes. 2 mg of ativan was given. Keppra was loaded and the patient was intubated for airway protection.   Per family, the patient has a history of cocaine use but has not used cocaine for the past year. On october 3rd the patient had eye pain and swelling with fever and vomiting. He was seen at [facility] ( no records of this in care everywhere). The family states that patient has several "rotten teeth" and per their understanding, they were told that the eye abscess came from extension of infection from the rotten  teeth.   Patient had the eye abscess drained about 2 weeks ago; prior to the drainage his eye was swollen shut and proptotic, which is confirmed by pre-drainage CT available on PACS. He was admitted for about 4 days at  [facility] and was placed on oral antibiotics at discharge (family does not know which abx). They state that he was finished taking the abx by Thursday 11/22/17.  He was also given hydrocodone for pain post surgery. Family stated that over the past few days his eye began to swell again and patient c/o HA. By Thursday 11/22/17 the patient's eye was swollen and beginning to turn purple again. They state that after the abscess drainage he did get better,  but recently started to decline again. They deny  that patient c/o any fever or chills, but per family, he has a high tolerance for pain and discomfort.    Ed course: 1 g Keppra loaded  2mg  ativan given  Versed drip started @ 12.5   Versed bolus given in CT of 12.5  and then rate increased to 20. Fentanyl drip @ 2.5  No past medical history on file.   No family history on file.             Social History:  has no tobacco, alcohol, and drug history on file.  Allergies not on file  MEDICATIONS:                                                                                                                     Current Facility-Administered Medications  Medication Dose Route Frequency Provider Last Rate Last Dose  . 0.9 %  sodium chloride infusion   Intravenous Continuous Gwyneth Sprout, MD 125 mL/hr at 11/24/17 1458    . fentaNYL (SUBLIMAZE) bolus via infusion 50 mcg  50 mcg Intravenous Q1H PRN Plunkett, Whitney, MD      . fentaNYL in NS (96mcg/ml) infusion-PREMIX  10 mcg/hr Intravenous Continuous Plunkett, Whitney, MD      . fentaNYL in NS (52mcg/ml) infusion-PREMIX  25-400 mcg/hr Intravenous Continuous Gwyneth Sprout, MD 2.5 mL/hr at 11/24/17 1516 25 mcg/hr at 11/24/17 1516  . iopamidol (ISOVUE-370)  76 % injection 100 mL  100 mL Intravenous Once PRN Gwyneth Sprout, MD      . iopamidol (ISOVUE-370) 76 % injection           . LORazepam (ATIVAN) 2 MG/ML injection           . midazolam (VERSED) 50 mg in sodium chloride 0.9 % 50 mL (1 mg/mL) infusion  0.5 mg/hr Intravenous Continuous Gwyneth Sprout, MD 12.5 mL/hr at 11/24/17 1554 12.5 mg/hr at 11/24/17 1554  . valproate (DEPACON) 2,494 mg in dextrose 5 % 50 mL IVPB  20 mg/kg Intravenous Once Caryl Pina, MD      . Melene Muller ON 11/25/2017] valproate (DEPACON) 624 mg in dextrose 5 % 50 mL IVPB  15 mg/kg/day Intravenous Q8H Caryl Pina, MD       No current outpatient medications on file.    ROS:  unobtainable from patient due to mental status   Blood pressure (!) 199/91, pulse (!) 104, temperature 98 F (36.7 C), temperature source Axillary, resp. rate 14, SpO2 97 %.   General Examination:                                                                                                      Physical Exam  HEENT- left eyelid with mild induration, purplish/brownish discoloration and evidence for prior surgery Lungs-intubation Extremities- Warm and well perfused  Neurological Examination Mental Status:  Intubated and sedated on Versed without responses to any external stimuli and no spontaneous movement.  Cranial Nerves: II: No blink to threat, pupils equal and sluggishly reactive (sedated on Versed) III,IV, VI: No doll's eye reflex (sedated) V,VII: Face flaccidly symmetric without blink to eyelid stimulation  (sedated) VIII: no response to vocal stimuli IX,X: Intubated XI: Unable to test XII: Intubated Motor/Sensory: Flaccid tone x 4 in the context of sedation. Bulk normal.  No movement to any stimuli except during intermittent focal seizure activity involving the RUE consisting of LUE stereotyped  flexion lasting for about 5-10 seconds each Sensory: Pinprick and light touch intact throughout, bilaterally Deep Tendon Reflexes: 2+ right brachioradialis and biceps. 1+ left brachioradialis, 0 left biceps. 0 patellae bilaterally. 2+ achilles bilaterally.  Plantars: Mute bilaterally Cerebellar/Gait: Unable to assess  Repeat exam after arrival to ICU reveals recurrence of intermittent LUE stereotyped flexion consistent with seizure, for which Versed rate was titrated up to 30 with 10 mg bolus ordered.    Lab Results: Basic Metabolic Panel: Recent Labs  Lab 11/24/17 1440  NA 136  K 5.0  CL 104  CO2 8*  GLUCOSE 309*  BUN 8  CREATININE 1.28*  CALCIUM 9.0    CBC: Recent Labs  Lab 11/24/17 1440  WBC 16.9*  NEUTROABS 13.0*  HGB 15.1  HCT 48.3  MCV 92.4  PLT 565*   Ammonia: 152  Imaging: Dg Chest Port 1 View  Result Date: 11/24/2017 CLINICAL DATA:  Status post intubation today. Patient unresponsive. Seizures. EXAM: PORTABLE CHEST 1 VIEW COMPARISON:  None. FINDINGS: New endotracheal tube is in place with the tip at the level of the clavicular heads, 6 cm above the carina. Defibrillator pad is noted. Lungs are clear. Heart size is normal. No pneumothorax or pleural fluid. IMPRESSION: ET tube in good position. Lungs clear. Electronically Signed   By: Drusilla Kanner M.D.   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 The source of the infection is most likely residual bacterial contamination from recently drained left intraorbital abscess with invasion into the anterior subdural spaces and brain parenchyma 1. He has been loaded with Keppra and valproic acid. Still with intermittent stereotyped RUE flexor  posturing consistent with focal status epilepticus versus generalized subclinical status with focal manifestation in RUE. Therefore, Versed gtt  has been titrated up and was at 30 mg/hr following a 12.5 mg and two 10 mg IV boluses at  7 PM this evening.  2. Neurosurgery has been consulted and plans surgery pending MRI results.  3. ED has contacted ID by phone and patient has been started on IV flagyl and vancomycin. Will defer to ID and ICU teams for scheduled dosing of meningitis and cerebritis-dose antibiotics.   Recommendations: 1. Continue Keppra at 1000 mg IV BID 2. Continue valproic acid at 5 mg/kg IV TID 3. Continue IV ABX. Will defer to ID and ICU teams for dosing 4. A sample of the empyema fluid should be sent to pathology during neurosurgical drainage procedure for gram stain, culture and sensitivity 5. Appreciate Neurosurgery's input and surgical management 6. LTM EEG after neurosurgery. May need to be put into burst suppression.  7. I have signed out to Dr. Amada Jupiter, who will titrate Versed with possible addition of propofol based on clinical findings followed by LTM EEG after surgery.  80 minutes spent in the emergent neurological evaluation and management of this critically ill patient. Time spent included serial clinical exams, review of imaging, and coordination of care.   I have seen and examined the patient. I have documented the neurological exam and formulated the assessment and recommendations.  Electronically signed: Dr. Caryl Pina 11/24/2017, 3:43 PM

## 2017-11-24 NOTE — ED Notes (Signed)
Helped get patient undress on the monitor did ekg shown to er doctor patient is resting with doctor at bedside and nurse

## 2017-11-24 NOTE — Progress Notes (Signed)
MRI reviewed, shows bifrontal subdural empyema with cerebritis. CT face shows left frontal sinusitis, ethmoid defect on the left of midline. Constellation of findings consistent with sinusitis with intracranial extension and subdural empyema. Will require emergent evacuation of intracranial infection to prevent septic thrombosis and infarct, will also require simultaneous skull base reconstruction.

## 2017-11-24 NOTE — ED Notes (Signed)
Pt received 1g Keppra IV at 1440, verbal order given by Dr. Anitra Lauth.

## 2017-11-24 NOTE — Progress Notes (Signed)
   11/24/17 1540  Clinical Encounter Type  Visited With Patient and family together  Visit Type Initial  Referral From Nurse  Consult/Referral To Chaplain  Chaplain responded to RN request to support Pt. family in ED.  Chaplain met Pt. son and daughter outside of Pt. room. The RN and MD participated in conversation with the Pt. children to determine the Pt. medical history. The Pt. daughter was visibly emotional but able to focus on the importance of conversation. The healthcare team informed Pt. children of phone calls from other family members. The Pt. daughter told the chaplain she is able to make the contacts.  The chaplain informed the Pt. Children how to get in touch with a chaplain if needed.

## 2017-11-25 ENCOUNTER — Other Ambulatory Visit: Payer: Self-pay

## 2017-11-25 ENCOUNTER — Inpatient Hospital Stay (HOSPITAL_COMMUNITY): Payer: Self-pay

## 2017-11-25 DIAGNOSIS — Z9911 Dependence on respirator [ventilator] status: Secondary | ICD-10-CM

## 2017-11-25 DIAGNOSIS — J9600 Acute respiratory failure, unspecified whether with hypoxia or hypercapnia: Secondary | ICD-10-CM

## 2017-11-25 DIAGNOSIS — G062 Extradural and subdural abscess, unspecified: Secondary | ICD-10-CM

## 2017-11-25 DIAGNOSIS — Z8669 Personal history of other diseases of the nervous system and sense organs: Secondary | ICD-10-CM

## 2017-11-25 DIAGNOSIS — G40901 Epilepsy, unspecified, not intractable, with status epilepticus: Secondary | ICD-10-CM

## 2017-11-25 DIAGNOSIS — Z955 Presence of coronary angioplasty implant and graft: Secondary | ICD-10-CM

## 2017-11-25 DIAGNOSIS — I2510 Atherosclerotic heart disease of native coronary artery without angina pectoris: Secondary | ICD-10-CM

## 2017-11-25 DIAGNOSIS — G060 Intracranial abscess and granuloma: Secondary | ICD-10-CM

## 2017-11-25 LAB — BLOOD GAS, ARTERIAL
Acid-base deficit: 2.8 mmol/L — ABNORMAL HIGH (ref 0.0–2.0)
BICARBONATE: 21.5 mmol/L (ref 20.0–28.0)
Drawn by: 24513
FIO2: 40
LHR: 32 {breaths}/min
O2 SAT: 98.3 %
PATIENT TEMPERATURE: 98.6
PEEP: 5 cmH2O
VT: 470 mL
pCO2 arterial: 37.1 mmHg (ref 32.0–48.0)
pH, Arterial: 7.381 (ref 7.350–7.450)
pO2, Arterial: 115 mmHg — ABNORMAL HIGH (ref 83.0–108.0)

## 2017-11-25 LAB — GLUCOSE, CAPILLARY
GLUCOSE-CAPILLARY: 116 mg/dL — AB (ref 70–99)
Glucose-Capillary: 112 mg/dL — ABNORMAL HIGH (ref 70–99)
Glucose-Capillary: 114 mg/dL — ABNORMAL HIGH (ref 70–99)

## 2017-11-25 LAB — BASIC METABOLIC PANEL
Anion gap: 10 (ref 5–15)
BUN: 7 mg/dL (ref 6–20)
CHLORIDE: 109 mmol/L (ref 98–111)
CO2: 20 mmol/L — ABNORMAL LOW (ref 22–32)
Calcium: 7.9 mg/dL — ABNORMAL LOW (ref 8.9–10.3)
Creatinine, Ser: 0.83 mg/dL (ref 0.61–1.24)
Glucose, Bld: 115 mg/dL — ABNORMAL HIGH (ref 70–99)
Potassium: 4.9 mmol/L (ref 3.5–5.1)
SODIUM: 139 mmol/L (ref 135–145)

## 2017-11-25 LAB — CBC
HCT: 38.1 % — ABNORMAL LOW (ref 39.0–52.0)
Hemoglobin: 12.3 g/dL — ABNORMAL LOW (ref 13.0–17.0)
MCH: 28.2 pg (ref 26.0–34.0)
MCHC: 32.3 g/dL (ref 30.0–36.0)
MCV: 87.4 fL (ref 80.0–100.0)
Platelets: 434 K/uL — ABNORMAL HIGH (ref 150–400)
RBC: 4.36 MIL/uL (ref 4.22–5.81)
RDW: 13.8 % (ref 11.5–15.5)
WBC: 15.5 K/uL — ABNORMAL HIGH (ref 4.0–10.5)
nRBC: 0 % (ref 0.0–0.2)

## 2017-11-25 LAB — TROPONIN I: TROPONIN I: 0.06 ng/mL — AB (ref <0.03)

## 2017-11-25 LAB — PHOSPHORUS: Phosphorus: 4.4 mg/dL (ref 2.5–4.6)

## 2017-11-25 LAB — VALPROIC ACID LEVEL: Valproic Acid Lvl: 38 ug/mL — ABNORMAL LOW (ref 50.0–100.0)

## 2017-11-25 LAB — MAGNESIUM: Magnesium: 2.2 mg/dL (ref 1.7–2.4)

## 2017-11-25 MED ORDER — LORAZEPAM 2 MG/ML IJ SOLN
2.0000 mg | Freq: Once | INTRAMUSCULAR | Status: AC
  Administered 2017-11-25: 2 mg via INTRAVENOUS
  Filled 2017-11-25: qty 1

## 2017-11-25 MED ORDER — ACETAMINOPHEN 160 MG/5ML PO SOLN
650.0000 mg | Freq: Four times a day (QID) | ORAL | Status: DC | PRN
  Administered 2017-11-25 – 2017-11-27 (×3): 650 mg via ORAL
  Filled 2017-11-25 (×3): qty 20.3

## 2017-11-25 MED ORDER — HEMOSTATIC AGENTS (NO CHARGE) OPTIME
TOPICAL | Status: DC | PRN
  Administered 2017-11-25: 1 via TOPICAL

## 2017-11-25 MED ORDER — VITAL HIGH PROTEIN PO LIQD
1000.0000 mL | ORAL | Status: DC
  Administered 2017-11-25: 1000 mL

## 2017-11-25 MED ORDER — BACITRACIN ZINC 500 UNIT/GM EX OINT
TOPICAL_OINTMENT | CUTANEOUS | Status: DC | PRN
  Administered 2017-11-25: 1 via TOPICAL

## 2017-11-25 MED ORDER — ORAL CARE MOUTH RINSE
15.0000 mL | OROMUCOSAL | Status: DC
  Administered 2017-11-25 – 2017-11-28 (×33): 15 mL via OROMUCOSAL

## 2017-11-25 MED ORDER — LIDOCAINE-EPINEPHRINE 1 %-1:100000 IJ SOLN
INTRAMUSCULAR | Status: DC | PRN
  Administered 2017-11-25: 10 mL

## 2017-11-25 MED ORDER — ARTIFICIAL TEARS OPHTHALMIC OINT
TOPICAL_OINTMENT | OPHTHALMIC | Status: DC | PRN
  Administered 2017-11-24: 1 via OPHTHALMIC

## 2017-11-25 MED ORDER — CHLORHEXIDINE GLUCONATE 0.12% ORAL RINSE (MEDLINE KIT)
15.0000 mL | Freq: Two times a day (BID) | OROMUCOSAL | Status: DC
  Administered 2017-11-25 – 2017-11-28 (×7): 15 mL via OROMUCOSAL

## 2017-11-25 MED ORDER — METRONIDAZOLE IN NACL 5-0.79 MG/ML-% IV SOLN
500.0000 mg | Freq: Three times a day (TID) | INTRAVENOUS | Status: DC
  Administered 2017-11-25 – 2017-11-30 (×15): 500 mg via INTRAVENOUS
  Filled 2017-11-25 (×18): qty 100

## 2017-11-25 MED ORDER — LORAZEPAM 2 MG/ML IJ SOLN
1.0000 mg | Freq: Once | INTRAMUSCULAR | Status: AC
  Administered 2017-11-25: 1 mg via INTRAVENOUS

## 2017-11-25 MED ORDER — SODIUM CHLORIDE 0.9 % IV SOLN
2.0000 g | Freq: Two times a day (BID) | INTRAVENOUS | Status: DC
  Administered 2017-11-25 – 2017-11-26 (×3): 2 g via INTRAVENOUS
  Filled 2017-11-25 (×4): qty 2

## 2017-11-25 MED ORDER — VALPROATE SODIUM 500 MG/5ML IV SOLN
1000.0000 mg | Freq: Once | INTRAVENOUS | Status: AC
  Administered 2017-11-25: 1000 mg via INTRAVENOUS
  Filled 2017-11-25: qty 10

## 2017-11-25 MED ORDER — METRONIDAZOLE IN NACL 5-0.79 MG/ML-% IV SOLN
500.0000 mg | Freq: Three times a day (TID) | INTRAVENOUS | Status: DC
  Filled 2017-11-25: qty 100

## 2017-11-25 MED ORDER — PIPERACILLIN-TAZOBACTAM 3.375 G IVPB 30 MIN: 3.3750 g | Freq: Four times a day (QID) | INTRAVENOUS | Status: DC

## 2017-11-25 MED ORDER — PIPERACILLIN-TAZOBACTAM 3.375 G IVPB
3.3750 g | Freq: Three times a day (TID) | INTRAVENOUS | Status: DC
  Administered 2017-11-25: 3.375 g via INTRAVENOUS
  Filled 2017-11-25 (×2): qty 50

## 2017-11-25 MED ORDER — LEVETIRACETAM IN NACL 1000 MG/100ML IV SOLN
1000.0000 mg | Freq: Once | INTRAVENOUS | Status: AC
  Administered 2017-11-25: 1000 mg via INTRAVENOUS
  Filled 2017-11-25: qty 100

## 2017-11-25 MED ORDER — PANTOPRAZOLE SODIUM 40 MG PO PACK
40.0000 mg | PACK | Freq: Every day | ORAL | Status: DC
  Administered 2017-11-25 – 2017-11-27 (×3): 40 mg
  Filled 2017-11-25 (×3): qty 20

## 2017-11-25 MED ORDER — LORAZEPAM 2 MG/ML IJ SOLN
INTRAMUSCULAR | Status: AC
  Filled 2017-11-25: qty 1

## 2017-11-25 MED ORDER — 0.9 % SODIUM CHLORIDE (POUR BTL) OPTIME
TOPICAL | Status: DC | PRN
  Administered 2017-11-25 (×3): 1000 mL

## 2017-11-25 MED ORDER — THROMBIN 20000 UNITS EX SOLR
CUTANEOUS | Status: DC | PRN
  Administered 2017-11-25: 01:00:00 via TOPICAL

## 2017-11-25 MED ORDER — THROMBIN 5000 UNITS EX SOLR
OROMUCOSAL | Status: DC | PRN
  Administered 2017-11-25: 01:00:00 via TOPICAL

## 2017-11-25 MED ORDER — LEVETIRACETAM IN NACL 1500 MG/100ML IV SOLN
1500.0000 mg | Freq: Two times a day (BID) | INTRAVENOUS | Status: DC
  Administered 2017-11-26 – 2017-11-30 (×10): 1500 mg via INTRAVENOUS
  Filled 2017-11-25 (×11): qty 100

## 2017-11-25 MED ORDER — VALPROATE SODIUM 500 MG/5ML IV SOLN
800.0000 mg | Freq: Three times a day (TID) | INTRAVENOUS | Status: DC
  Administered 2017-11-26 – 2017-12-01 (×16): 800 mg via INTRAVENOUS
  Filled 2017-11-25 (×18): qty 8

## 2017-11-25 MED ORDER — SODIUM CHLORIDE 0.9 % IV SOLN
INTRAVENOUS | Status: DC | PRN
  Administered 2017-11-25: 01:00:00

## 2017-11-25 NOTE — Progress Notes (Signed)
Neurosurgery Service Progress Note  Subjective: No acute events overnight post-op, no clear seizure activity for nursing overnight   Objective: Vitals:   11/25/17 0809 11/25/17 0900 11/25/17 1000 11/25/17 1116  BP: (!) 134/92 (!) 141/85 137/89 (!) 147/92  Pulse: (!) 110 (!) 106 (!) 105 (!) 110  Resp: (!) 23 16 (!) 27 (!) 32  Temp:      TempSrc:      SpO2: 97% 96% 97% 97%  Weight:      Height:       Temp (24hrs), Avg:98.9 F (37.2 C), Min:98 F (36.7 C), Max:100 F (37.8 C)  CBC Latest Ref Rng & Units 11/25/2017 11/24/2017  WBC 4.0 - 10.5 K/uL 15.5(H) 16.9(H)  Hemoglobin 13.0 - 17.0 g/dL 12.3(L) 15.1  Hematocrit 39.0 - 52.0 % 38.1(L) 48.3  Platelets 150 - 400 K/uL 434(H) 565(H)   BMP Latest Ref Rng & Units 11/25/2017 11/24/2017  Glucose 70 - 99 mg/dL 115(H) 309(H)  BUN 6 - 20 mg/dL 7 8  Creatinine 0.61 - 1.24 mg/dL 0.83 1.28(H)  Sodium 135 - 145 mmol/L 139 136  Potassium 3.5 - 5.1 mmol/L 4.9 5.0  Chloride 98 - 111 mmol/L 109 104  CO2 22 - 32 mmol/L 20(L) 8(L)  Calcium 8.9 - 10.3 mg/dL 7.9(L) 9.0    Intake/Output Summary (Last 24 hours) at 11/25/2017 1134 Last data filed at 11/25/2017 1000 Gross per 24 hour  Intake 1188.13 ml  Output 1600 ml  Net -411.87 ml    Current Facility-Administered Medications:  .  0.9 %  sodium chloride infusion, , Intravenous, Continuous, Plunkett, Whitney, MD, Last Rate: 10 mL/hr at 11/25/17 0800 .  chlorhexidine gluconate (MEDLINE KIT) (PERIDEX) 0.12 % solution 15 mL, 15 mL, Mouth Rinse, BID, Kerney Elbe, MD, 15 mL at 11/25/17 0736 .  famotidine (PEPCID) IVPB 20 mg premix, 20 mg, Intravenous, Q12H, Mannam, Praveen, MD, Last Rate: 100 mL/hr at 11/25/17 1000 .  fentaNYL (SUBLIMAZE) bolus via infusion 50 mcg, 50 mcg, Intravenous, Q1H PRN, Maryan Rued, Whitney, MD .  fentaNYL (SUBLIMAZE) injection 100 mcg, 100 mcg, Intravenous, Q15 min PRN, Mannam, Praveen, MD .  fentaNYL (SUBLIMAZE) injection 100 mcg, 100 mcg, Intravenous, Q2H PRN, Mannam,  Praveen, MD .  fentaNYL 2595mg in NS 2514m(1055mml) infusion-PREMIX, 10 mcg/hr, Intravenous, Continuous, Plunkett, Whitney, MD, Last Rate: 5 mL/hr at 11/24/17 2315, 50 mcg/hr at 11/24/17 2315 .  fentaNYL 2500m51mn NS 250mL16mmcg57m infusion-PREMIX, 25-400 mcg/hr, Intravenous, Continuous, Plunkett, Whitney, MD, Last Rate: 10 mL/hr at 11/25/17 1000, 100 mcg/hr at 11/25/17 1000 .  iopamidol (ISOVUE-370) 76 % injection 100 mL, 100 mL, Intravenous, Once PRN, Plunkett, Whitney, MD .  lactated ringers infusion, , Intravenous, Continuous, Mannam, Praveen, MD, Last Rate: 75 mL/hr at 11/25/17 0800 .  levETIRAcetam (KEPPRA) IVPB 1000 mg/100 mL premix, 1,000 mg, Intravenous, Q12H, LindzeKerney Elbe  MEDLINE mouth rinse, 15 mL, Mouth Rinse, 10 times per day, LindzeKerney Elbe15 mL at 11/25/17 1125 .  midazolam (VERSED) 100 mg in sodium chloride 0.9 % 100 mL (1 mg/mL) infusion, 5 mg/hr, Intravenous, Continuous, KirkpaGreta DoomStopped at 11/25/17 0927 .[phone]-tazobactam (ZOSYN) IVPB 3.375 g, 3.375 g, Intravenous, Q8H, Mannam, Praveen, MD, Last Rate: 12.5 mL/hr at 11/25/17 1000 .  valproate (DEPACON) 624 mg in dextrose 5 % 50 mL IVPB, 15 mg/kg/day, Intravenous, Q8H, LindzeKerney ElbeStopped at 11/25/17 0636 .  vancomycin (VANCOCIN) IVPB 1000 mg/200 mL premix, 1,000 mg, Intravenous, Q12H, Mannam, Praveen, MD, Stopped at 11/25/17 0745  Physical Exam: Intubated, sedated w/ midazolam and fentanyl, pupils pinpoint OU, gaze neutral, +c/c/g, no movement to painful stimulus  Assessment & Plan: 42 y.o. man with prior intra-orbital abscess and sinusitis, presented with status epilepticus 2/2 subdural empyema and brain abscess. 10/20 s/p emergent bifrontal craniotomy, evacuation of abscess and subdural empyema, cranialization / exenteration of frontal sinus, skull base reconstruction. -CTH reviewed, shows expected post-operative changes -sedation / status / ABx per CCM, appreciate  care -okay for EEG leads on scalp -cultures pending, one from epidural space, which was in continuity with the frontal sinus, second is from pus in right frontal brain abscess -SCDs/TEDs, from a neurosurgery perspective okay with SQH on POD2 (10/22)  Marcello Moores A Shadie Sweatman  11/25/17 11:34 AM

## 2017-11-25 NOTE — Transfer of Care (Signed)
Immediate Anesthesia Transfer of Care Note  Patient: Ryan Bryan  Procedure(s) Performed: Bilateral Craniotomy for Infection Evacuation and Skull Base Defect Repair (Bilateral Head)  Patient Location: ICU  Anesthesia Type:General  Level of Consciousness: Patient remains intubated per anesthesia plan  Airway & Oxygen Therapy: Patient remains intubated per anesthesia plan and Patient placed on Ventilator (see vital sign flow sheet for setting)  Post-op Assessment: Report given to RN and Post -op Vital signs reviewed and stable  Post vital signs: Reviewed and stable  Last Vitals:  Vitals Value Taken Time  BP 141/73 11/25/2017  4:03 AM  Temp    Pulse 106 11/25/2017  4:10 AM  Resp 22 11/25/2017  4:10 AM  SpO2 97 % 11/25/2017  4:10 AM  Vitals shown include unvalidated device data.  Last Pain:  Vitals:   11/24/17 1945  TempSrc: Axillary         Complications: No apparent anesthesia complications

## 2017-11-25 NOTE — Progress Notes (Addendum)
Subjective: Status post emergent bifrontal craniotomy with evacuation of abscess and subdural empyema with skull base reconstruction.    Objective: Current vital signs: BP (!) 149/67   Pulse (!) 111   Temp (!) 100.5 F (38.1 C) (Axillary)   Resp (!) 25   Ht '6\' 1"'$  (1.854 m)   Wt 124.7 kg   SpO2 96%   BMI 36.27 kg/m  Vital signs in last 24 hours: Temp:  [98.6 F (37 C)-100.5 F (38.1 C)] 100.5 F (38.1 C) (10/20 1200) Pulse Rate:  [66-127] 111 (10/20 1508) Resp:  [16-32] 25 (10/20 1508) BP: (105-152)/(61-98) 149/67 (10/20 1508) SpO2:  [93 %-100 %] 96 % (10/20 1509) Arterial Line BP: (126-154)/(59-69) 147/66 (10/20 1500) FiO2 (%):  [40 %-100 %] 40 % (10/20 1509) Weight:  [124.7 kg] 124.7 kg (10/20 0500)  Intake/Output from previous day: 10/19 0701 - 10/20 0700 In: 719.8 [I.V.:614.1; IV Piggyback:105.7] Out: 1100 [Urine:800; Blood:300] Intake/Output this shift: Total I/O In: 720.4 [I.V.:444.6; IV Piggyback:275.8] Out: 500 [Urine:500] Nutritional status:  Diet Order            Diet NPO time specified  Diet effective now             HEENT: Scalp swelling and postoperative changes noted.  Lungs: Intubated Ext: Well perfused  Neurologic Exam: Ment: Sedated with 5 mg/hr Versed. GCS 3 CN: No doll's eye reflex. Pupils small and unreactive. Face flaccidly symmetric.  Motor/Sensory: No movement to light noxious stimuli x 4. Decreased tone x 4, more so to LUE and LLE relative to the right. No jerking, twitching or posturing noted.   Lab Results: Results for orders placed or performed during the hospital encounter of 11/24/17 (from the past 48 hour(s))  CBC with Differential/Platelet     Status: Abnormal   Collection Time: 11/24/17  2:40 PM  Result Value Ref Range   WBC 16.9 (H) 4.0 - 10.5 K/uL   RBC 5.23 4.22 - 5.81 MIL/uL   Hemoglobin 15.1 13.0 - 17.0 g/dL   HCT 48.3 39.0 - 52.0 %   MCV 92.4 80.0 - 100.0 fL   MCH 28.9 26.0 - 34.0 pg   MCHC 31.3 30.0 - 36.0 g/dL    RDW 13.6 11.5 - 15.5 %   Platelets 565 (H) 150 - 400 K/uL   nRBC 0.0 0.0 - 0.2 %   Neutrophils Relative % 78 %   Neutro Abs 13.0 (H) 1.7 - 7.7 K/uL   Lymphocytes Relative 14 %   Lymphs Abs 2.4 0.7 - 4.0 K/uL   Monocytes Relative 3 %   Monocytes Absolute 0.6 0.1 - 1.0 K/uL   Eosinophils Relative 1 %   Eosinophils Absolute 0.2 0.0 - 0.5 K/uL   Basophils Relative 0 %   Basophils Absolute 0.1 0.0 - 0.1 K/uL   Immature Granulocytes 4 %   Abs Immature Granulocytes 0.63 (H) 0.00 - 0.07 K/uL    Comment: Performed at [facility], [address], [place], [place] [address]  Comprehensive metabolic panel     Status: Abnormal   Collection Time: 11/24/17  2:40 PM  Result Value Ref Range   Sodium 136 135 - 145 mmol/L   Potassium 5.0 3.5 - 5.1 mmol/L    Comment: HEMOLYSIS AT THIS LEVEL MAY AFFECT RESULT   Chloride 104 98 - 111 mmol/L   CO2 8 (L) 22 - 32 mmol/L   Glucose, Bld 309 (H) 70 - 99 mg/dL   BUN 8 6 - 20 mg/dL  Creatinine, Ser 1.28 (H) 0.61 - 1.24 mg/dL   Calcium 9.0 8.9 - 10.3 mg/dL   Total Protein 7.7 6.5 - 8.1 g/dL   Albumin 3.8 3.5 - 5.0 g/dL   AST 40 15 - 41 U/L   ALT 24 0 - 44 U/L   Alkaline Phosphatase 84 38 - 126 U/L   Total Bilirubin 0.7 0.3 - 1.2 mg/dL   GFR calc non Af Amer >60 >60 mL/min   GFR calc Af Amer >60 >60 mL/min    Comment: (NOTE) The eGFR has been calculated using the CKD EPI equation. This calculation has not been validated in all clinical situations. eGFR's persistently <60 mL/min signify possible Chronic Kidney Disease.    Anion gap 24 (H) 5 - 15    Comment: Performed at [facility], [place] [address], [place], [place] [address]  Ethanol     Status: None   Collection Time: 11/24/17  2:40 PM  Result Value Ref Range   Alcohol, Ethyl (B) <10 <10 mg/dL    Comment: (NOTE) Lowest detectable limit for serum alcohol is 10 mg/dL. For medical purposes only. Performed at [facility], [place] [address], [place], [place] [address]   Ammonia      Status: Abnormal   Collection Time: 11/24/17  2:40 PM  Result Value Ref Range   Ammonia 152 (H) 9 - 35 umol/L    Comment: HEMOLYSIS AT THIS LEVEL MAY AFFECT RESULT Performed at [facility], [place] [address], [place], [place] [address]   I-stat troponin, ED     Status: None   Collection Time: 11/24/17  3:00 PM  Result Value Ref Range   Troponin i, poc 0.00 0.00 - 0.08 ng/mL   Comment 3            Comment: Due to the release kinetics of cTnI, a negative result within the first hours of the onset of symptoms does not rule out myocardial infarction with certainty. If myocardial infarction is still suspected, repeat the test at appropriate intervals.   Rapid urine drug screen (hospital performed)     Status: Abnormal   Collection Time: 11/24/17  4:12 PM  Result Value Ref Range   Opiates NONE DETECTED NONE DETECTED   Cocaine NONE DETECTED NONE DETECTED   Benzodiazepines POSITIVE (A) NONE DETECTED   Amphetamines NONE DETECTED NONE DETECTED   Tetrahydrocannabinol POSITIVE (A) NONE DETECTED   Barbiturates NONE DETECTED NONE DETECTED    Comment: (NOTE) DRUG SCREEN FOR MEDICAL PURPOSES ONLY.  IF CONFIRMATION IS NEEDED FOR ANY PURPOSE, NOTIFY LAB WITHIN 5 DAYS. LOWEST DETECTABLE LIMITS FOR URINE DRUG SCREEN Drug Class                     Cutoff (ng/mL) Amphetamine and metabolites    1000 Barbiturate and metabolites    200 Benzodiazepine                 938 Tricyclics and metabolites     300 Opiates and metabolites        300 Cocaine and metabolites        300 THC                            50 Performed at [facility], [place] [address], [place], [place] [address]   Urinalysis, Routine w reflex microscopic     Status: Abnormal   Collection Time: 11/24/17  4:12  PM  Result Value Ref Range   Color, Urine YELLOW YELLOW   APPearance HAZY (A) CLEAR   Specific Gravity, Urine 1.012 1.005 - 1.030   pH 5.0 5.0 - 8.0   Glucose, UA >=500 (A) NEGATIVE mg/dL   Hgb urine dipstick  MODERATE (A) NEGATIVE   Bilirubin Urine NEGATIVE NEGATIVE   Ketones, ur NEGATIVE NEGATIVE mg/dL   Protein, ur 100 (A) NEGATIVE mg/dL   Nitrite NEGATIVE NEGATIVE   Leukocytes, UA NEGATIVE NEGATIVE   RBC / HPF 0-5 0 - 5 RBC/hpf   WBC, UA 0-5 0 - 5 WBC/hpf   Bacteria, UA NONE SEEN NONE SEEN   Squamous Epithelial / LPF 0-5 0 - 5   Mucus PRESENT    Hyaline Casts, UA PRESENT    Granular Casts, UA PRESENT     Comment: Performed at [facility] [address], [place], [place] [address]  I-Stat arterial blood gas, ED     Status: Abnormal   Collection Time: 11/24/17  5:27 PM  Result Value Ref Range   pH, Arterial 7.291 (L) 7.350 - 7.450   pCO2 arterial 41.3 32.0 - 48.0 mmHg   pO2, Arterial 253.0 (H) 83.0 - 108.0 mmHg   Bicarbonate 19.9 (L) 20.0 - 28.0 mmol/L   TCO2 21 (L) 22 - 32 mmol/L   O2 Saturation 100.0 %   Acid-base deficit 6.0 (H) 0.0 - 2.0 mmol/L   Patient temperature 98.6 F    Collection site RADIAL, ALLEN'S TEST ACCEPTABLE    Drawn by RT    Sample type ARTERIAL   Troponin I     Status: Abnormal   Collection Time: 11/24/17  7:15 PM  Result Value Ref Range   Troponin I 0.06 (HH) <0.03 ng/mL    Comment: CRITICAL RESULT CALLED TO, READ BACK BY AND VERIFIED WITH: Margot Ables RN @ 2015 ON 11/24/17 BY TEMOCHE, H Performed at [facility], [address], [place], [place] [address]   Lactic acid, plasma     Status: Abnormal   Collection Time: 11/24/17  7:15 PM  Result Value Ref Range   Lactic Acid, Venous 2.9 (HH) 0.5 - 1.9 mmol/L    Comment: CRITICAL RESULT CALLED TO, READ BACK BY AND VERIFIED WITH: Margot Ables RN @ 2008 ON 11/24/17 BY TEMOCHE, H Performed at [facility], [place] [address], [place], [place] [address]   Culture, blood (routine x 2)     Status: None (Preliminary result)   Collection Time: 11/24/17  7:15 PM  Result Value Ref Range   Specimen Description BLOOD BLOOD RIGHT HAND    Special Requests      Blood Culture adequate volume BOTTLES DRAWN  AEROBIC ONLY   Culture      NO GROWTH < 24 HOURS Performed at [facility], [place] [address] [place], [place] [address]    Report Status PENDING   Culture, blood (routine x 2)     Status: None (Preliminary result)   Collection Time: 11/24/17  7:15 PM  Result Value Ref Range   Specimen Description BLOOD BLOOD RIGHT HAND    Special Requests      BOTTLES DRAWN AEROBIC ONLY Blood Culture adequate volume   Culture      NO GROWTH < 24 HOURS Performed at [facility], [place] [address], [place], [place] [address]    Report Status PENDING   MRSA PCR Screening     Status: None   Collection Time: 11/24/17  7:51 PM  Result Value Ref Range   MRSA by PCR NEGATIVE NEGATIVE    Comment:        The GeneXpert MRSA Assay (FDA approved for NASAL specimens only), is one component of a comprehensive MRSA colonization surveillance program. It is not intended to diagnose MRSA infection nor to guide or monitor treatment for MRSA infections. Performed at [facility], [place] [address], [place], [place] [address]   Lactic acid, plasma     Status: Abnormal   Collection Time: 11/24/17 10:32 PM  Result Value Ref Range   Lactic Acid, Venous 2.8 (HH) 0.5 - 1.9 mmol/L    Comment: CRITICAL RESULT CALLED TO, READ BACK BY AND VERIFIED WITH: McMURRAY,N RN 11/24/2017 2328 JORDANS Performed at [facility], [place] [address], [place], [place] [address]   Aerobic/Anaerobic Culture (surgical/deep wound)     Status: None (Preliminary result)   Collection Time: 11/25/17  2:02 AM  Result Value Ref Range   Specimen Description ABSCESS    Special Requests BRAIN ID A    Gram Stain      NO WBC SEEN NO ORGANISMS SEEN Performed at [facility], [place] [address] [place], [place] [address]    Culture PENDING    Report Status PENDING   Aerobic/Anaerobic Culture (surgical/deep wound)     Status: None (Preliminary result)   Collection Time: 11/25/17  2:46 AM  Result Value Ref Range   Specimen  Description ABSCESS    Special Requests BRAIN  ID B    Gram Stain      RARE WBC PRESENT, PREDOMINANTLY PMN NO ORGANISMS SEEN Performed at [facility] [address] [place], [place] [address]    Culture PENDING    Report Status PENDING   Troponin I     Status: Abnormal   Collection Time: 11/25/17  4:24 AM  Result Value Ref Range   Troponin I 0.06 (HH) <0.03 ng/mL    Comment: CRITICAL VALUE NOTED.  VALUE IS CONSISTENT WITH PREVIOUSLY REPORTED AND CALLED VALUE. Performed at [facility], [place] [address], [place], [place] [address]   CBC     Status: Abnormal   Collection Time: 11/25/17  4:24 AM  Result Value Ref Range   WBC 15.5 (H) 4.0 - 10.5 K/uL   RBC 4.36 4.22 - 5.81 MIL/uL   Hemoglobin 12.3 (L) 13.0 - 17.0 g/dL   HCT 38.1 (L) 39.0 - 52.0 %   MCV 87.4 80.0 - 100.0 fL   MCH 28.2 26.0 - 34.0 pg   MCHC 32.3 30.0 - 36.0 g/dL   RDW 13.8 11.5 - 15.5 %   Platelets 434 (H) 150 - 400 K/uL   nRBC 0.0 0.0 - 0.2 %    Comment: Performed at [facility], [place] [address] [place], [place] [address]  Basic metabolic panel     Status: Abnormal   Collection Time: 11/25/17  4:24 AM  Result Value Ref Range   Sodium 139 135 - 145 mmol/L   Potassium 4.9 3.5 - 5.1 mmol/L   Chloride 109 98 - 111 mmol/L   CO2 20 (L) 22 - 32 mmol/L   Glucose, Bld 115 (H) 70 - 99 mg/dL   BUN 7 6 - 20 mg/dL   Creatinine, Ser 0.83 0.61 - 1.24 mg/dL   Calcium 7.9 (L) 8.9 - 10.3 mg/dL   GFR calc non Af Amer >60 >60 mL/min   GFR calc Af Amer >60 >60 mL/min  Comment: (NOTE) The eGFR has been calculated using the CKD EPI equation. This calculation has not been validated in all clinical situations. eGFR's persistently <60 mL/min signify possible Chronic Kidney Disease.    Anion gap 10 5 - 15    Comment: Performed at [facility] [address] [place], [place] [address]  Magnesium     Status: None   Collection Time: 11/25/17  4:24 AM  Result Value Ref Range   Magnesium 2.2 1.7 - 2.4  mg/dL    Comment: Performed at [facility] [address] [place], [place] [address]  Phosphorus     Status: None   Collection Time: 11/25/17  4:24 AM  Result Value Ref Range   Phosphorus 4.4 2.5 - 4.6 mg/dL    Comment: Performed at [facility] [address] [place], [place] [address]  Blood gas, arterial     Status: Abnormal   Collection Time: 11/25/17  5:50 AM  Result Value Ref Range   FIO2 40.00    Delivery systems VENTILATOR    Mode PRESSURE REGULATED VOLUME CONTROL    VT 470 mL   LHR 32.0 resp/min   Peep/cpap 5.0 cm H20   pH, Arterial 7.381 7.350 - 7.450   pCO2 arterial 37.1 32.0 - 48.0 mmHg   pO2, Arterial 115 (H) 83.0 - 108.0 mmHg   Bicarbonate 21.5 20.0 - 28.0 mmol/L   Acid-base deficit 2.8 (H) 0.0 - 2.0 mmol/L   O2 Saturation 98.3 %   Patient temperature 98.6    Collection site A-LINE    Drawn by [phone]    Sample type ARTERIAL   Culture, respiratory (tracheal aspirate)     Status: None (Preliminary result)   Collection Time: 11/25/17  8:27 AM  Result Value Ref Range   Specimen Description TRACHEAL ASPIRATE    Special Requests NONE    Gram Stain      RARE WBC PRESENT,BOTH PMN AND MONONUCLEAR FEW GRAM POSITIVE COCCI IN CLUSTERS Performed at [facility], [address], [place], [place] [address]    Culture PENDING    Report Status PENDING   Glucose, capillary     Status: Abnormal   Collection Time: 11/25/17  3:36 PM  Result Value Ref Range   Glucose-Capillary 112 (H) 70 - 99 mg/dL   Comment 1 Notify RN    Comment 2 Document in Chart     Recent Results (from the past 240 hour(s))  Culture, blood (routine x 2)     Status: None (Preliminary result)   Collection Time: 11/24/17  7:15 PM  Result Value Ref Range Status   Specimen Description BLOOD BLOOD RIGHT HAND  Final   Special Requests   Final    Blood Culture adequate volume BOTTLES DRAWN AEROBIC ONLY   Culture   Final    NO GROWTH < 24 HOURS Performed at [facility],  [place] [address], [place], [place] [address]    Report Status PENDING  Incomplete  Culture, blood (routine x 2)     Status: None (Preliminary result)   Collection Time: 11/24/17  7:15 PM  Result Value Ref Range Status   Specimen Description BLOOD BLOOD RIGHT HAND  Final   Special Requests   Final    BOTTLES DRAWN AEROBIC ONLY Blood Culture adequate volume   Culture   Final    NO GROWTH < 24 HOURS Performed at [facility], [place] [address], [place], [place] [address]  Report Status PENDING  Incomplete  MRSA PCR Screening     Status: None   Collection Time: 11/24/17  7:51 PM  Result Value Ref Range Status   MRSA by PCR NEGATIVE NEGATIVE Final    Comment:        The GeneXpert MRSA Assay (FDA approved for NASAL specimens only), is one component of a comprehensive MRSA colonization surveillance program. It is not intended to diagnose MRSA infection nor to guide or monitor treatment for MRSA infections. Performed at [facility], [place] [address] [place], [place] [address]   Aerobic/Anaerobic Culture (surgical/deep wound)     Status: None (Preliminary result)   Collection Time: 11/25/17  2:02 AM  Result Value Ref Range Status   Specimen Description ABSCESS  Final   Special Requests BRAIN ID A  Final   Gram Stain   Final    NO WBC SEEN NO ORGANISMS SEEN Performed at [facility], [place] [address] [place], [place] [address]    Culture PENDING  Incomplete   Report Status PENDING  Incomplete  Aerobic/Anaerobic Culture (surgical/deep wound)     Status: None (Preliminary result)   Collection Time: 11/25/17  2:46 AM  Result Value Ref Range Status   Specimen Description ABSCESS  Final   Special Requests BRAIN  ID B  Final   Gram Stain   Final    RARE WBC PRESENT, PREDOMINANTLY PMN NO ORGANISMS SEEN Performed at [facility], [place] [address] [place], [place] [address]    Culture PENDING  Incomplete   Report Status PENDING  Incomplete  Culture, respiratory  (tracheal aspirate)     Status: None (Preliminary result)   Collection Time: 11/25/17  8:27 AM  Result Value Ref Range Status   Specimen Description TRACHEAL ASPIRATE  Final   Special Requests NONE  Final   Gram Stain   Final    RARE WBC PRESENT,BOTH PMN AND MONONUCLEAR FEW GRAM POSITIVE COCCI IN CLUSTERS Performed at [facility], [address] [place], [place] [address]    Culture PENDING  Incomplete   Report Status PENDING  Incomplete    Lipid Panel No results for input(s): CHOL, TRIG, HDL, CHOLHDL, VLDL, LDLCALC in the last 72 hours.  Studies/Results: Ct Head Wo Contrast  Result Date: 11/25/2017 CLINICAL DATA:  Status post craniotomy with evacuation of abscess and empyema. Frontal sinus cranialization. Skull base defect repair. Evacuation of abscess and empyema. EXAM: CT HEAD WITHOUT CONTRAST TECHNIQUE: Contiguous axial images were obtained from the base of the skull through the vertex without intravenous contrast. COMPARISON:  MRI brain 11/24/2017 FINDINGS: Brain: Bifrontal craniotomy is noted. Bilateral extra-axial collections are evaluated. There is residual edema in the anterior inferior left frontal lobe. Minimal extra-axial fluid and gas remains. Minimal extra-axial fluid and gas is also present on right, decreased from yesterday. New collections are present. Minimal subfalcine herniation is improved Vascular: No hyperdense vessel or unexpected calcification. Skull: Bifrontal craniotomies are noted. Associated scalp soft tissue swelling is present. Sinuses/Orbits: Left maxillary antrostomy is noted. There is increased fluid in left maxillary sinus. Circumferential mucosal thickening is again seen. Ethmoid mucosal disease is unchanged. Frontal sinuses are cranialized. IMPRESSION: 1. 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 2. Residual edema remains, left greater than right with decreased mass effect and subfalcine herniation. 3.  Cranialization of frontal sinuses. Electronically Signed   By: San Morelle M.D.   On: 11/25/2017 12:18   Ct Head W &  Wo Contrast  Result Date: 11/24/2017 CLINICAL DATA:  Altered level of consciousness, unexplained EXAM: CT HEAD WITHOUT AND WITH CONTRAST TECHNIQUE: Contiguous axial images were obtained from the base of the skull through the vertex without and with intravenous contrast CONTRAST:  56m ISOVUE-370 IOPAMIDOL (ISOVUE-370) INJECTION 76% COMPARISON:  11/04/2017 FINDINGS: Brain: There are rim enhancing subfrontal collections on both sides and in the midline. On the left the collection measures up to 3 cm in diameter and 14 mm in thickness on the right the collection is 2.7 cm in diameter and 12 mm in thickness. The midline collection is along the posterior planum sphenoidale, 2.7 cm in maximum diameter by 7 mm in thickness. These are consistent with subdural empyemas in this patient with complicated sinusitis seen recently. There is marked edema in the bilateral inferior frontal lobes. No hydrocephalus, shift, or herniation. Vascular: Major vessels are enhancing Sinuses/Orbits: Anterior nasal septum perforation. Changes of endoscopic sinus surgery with improved drainage. There is still active sinusitis, greatest on the left, with maxillary fluid level and extensive frontal opacification. Significantly improved extraconal abscess in the left superior orbit, with trace fluid density seen on coronal reformats measuring only 1 mm in thickness. Reportedly there was eye surgery last week at outside facility. Other: Critical Value/emergent results were called by telephone at the time of interpretation on 11/24/2017 at 5:25 pm to Dr. WBlanchie Dessert, who verbally acknowledged these results. IMPRESSION: 1. Three separate subdural empyemas in the anterior cranial fossa located bilaterally and in the midline. These collections measure up to 3 cm in diameter and 1.4 cm in thickness. Marked adjacent  vasogenic edema. 2. Improved extraconal abscess in the superior left orbit with minimal fluid seen on coronal reformats. 3. Interval endoscopic sinus surgery but still active sinusitis Electronically Signed   By: JMonte FantasiaM.D.   On: 11/24/2017 17:28   Mr Brain W And Wo Contrast  Result Date: 11/24/2017 CLINICAL DATA:  Intracranial complications of sinusitis. EXAM: MRI HEAD WITHOUT AND WITH CONTRAST TECHNIQUE: Multiplanar, multiecho pulse sequences of the brain and surrounding structures were obtained without and with intravenous contrast. CONTRAST:  12 mL Gadavist COMPARISON:  CT sinus 11/24/2017 FINDINGS: BRAIN: There are 3 extra-axial collections of the anterior cranial fossa that show internal diffusion restriction. The right most collection measures 3.1 x 1.1 x 3.4 cm. The midline collection measures 0.8 x 3.0 x 0.7 cm. The left most collection measures 3.1 x 2.6 x 1.4 cm. There is a large amount of surrounding vasogenic edema in the frontal lobes, left-greater-than-right. There is no parenchymal contrast enhancement. Otherwise, the brain parenchymal signal is normal. The cerebral and cerebellar volume are age-appropriate. Susceptibility-sensitive sequences show no chronic microhemorrhage or superficial siderosis. VASCULAR: Major intracranial arterial and venous sinus flow voids are normal. SKULL AND UPPER CERVICAL SPINE: Calvarial bone marrow signal is normal. There is no skull base mass. Visualized upper cervical spine and soft tissues are normal. SINUSES/ORBITS: Status post left ethmoidectomy and left maxillary antrostomy. Moderate residual left maxillary mucosal thickening. Left frontal recess remains occluded. Unchanged mild left proptosis compared to earlier CT. No residual orbital abscess. IMPRESSION: 1. Three anterior cranial fossa subdural empyemas with surrounding vasogenic edema that is worst in the left frontal lobe. The largest collection, on the left, measures 3.1 x 2.6 x 1.4 cm. 2. No  cerebral parenchymal contrast enhancement to indicate cerebritis. 3. Status post left maxillary antrostomy and left ethmoidectomy with drainage of extraconal left orbital abscess. Mild residual proptosis. Left frontal recess remains occluded.  Electronically Signed   By: Ulyses Jarred M.D.   On: 11/24/2017 22:11   Dg Chest Port 1 View  Result Date: 11/25/2017 CLINICAL DATA:  42 year old male with a history of brain abscess EXAM: PORTABLE CHEST 1 VIEW COMPARISON:  11/24/2017 FINDINGS: Cardiomediastinal silhouette unchanged in size and contour. Removal of the defibrillator pads. Enteric tube terminates suitably above the carina, approximately 5.4 cm. Gastric tube has been placed in the interval, terminating out of the field of view. Low lung volumes with patchy opacities at the lung bases similar to prior. No pneumothorax or pleural effusion. IMPRESSION: Low lung volumes with likely atelectasis. Endotracheal tube suitably position above the carina. Interval placement of gastric tube. Electronically Signed   By: Corrie Mckusick D.O.   On: 11/25/2017 08:42   Dg Chest Port 1 View  Result Date: 11/24/2017 CLINICAL DATA:  Status post intubation today. Patient unresponsive. Seizures. EXAM: PORTABLE CHEST 1 VIEW COMPARISON:  None. FINDINGS: New endotracheal tube is in place with the tip at the level of the clavicular heads, 6 cm above the carina. Defibrillator pad is noted. Lungs are clear. Heart size is normal. No pneumothorax or pleural fluid. IMPRESSION: ET tube in good position. Lungs clear. Electronically Signed   By: Inge Rise M.D.   On: 11/24/2017 16:10   Dg Abd Portable 1v  Result Date: 11/24/2017 CLINICAL DATA:  OG tube placement EXAM: PORTABLE ABDOMEN - 1 VIEW COMPARISON:  None. FINDINGS: OG tube coils in the fundus with the tip in the distal stomach. IMPRESSION: OG tube tip in the distal stomach. Electronically Signed   By: Rolm Baptise M.D.   On: 11/24/2017 20:06   Ct Angio Chest/abd/pel For  Dissection W And/or Wo Contrast  Result Date: 11/24/2017 CLINICAL DATA:  Unresponsive.  Back pain.  History of hypertension. EXAM: CT ANGIOGRAPHY CHEST, ABDOMEN AND PELVIS TECHNIQUE: Multidetector CT imaging through the chest, abdomen and pelvis was performed using the standard protocol during bolus administration of intravenous contrast. Multiplanar reconstructed images and MIPs were obtained and reviewed to evaluate the vascular anatomy. CONTRAST:  57m ISOVUE-370 IOPAMIDOL (ISOVUE-370) INJECTION 76% COMPARISON:  None. FINDINGS: CTA CHEST FINDINGS Cardiovascular: Heart is mildly enlarged. Aorta is normal caliber. No dissection. No filling defects in the pulmonary arteries to suggest pulmonary emboli. Coronary artery calcifications in the left anterior descending coronary artery. Mediastinum/Nodes: No mediastinal, hilar, or axillary adenopathy. Lungs/Pleura: Tendon atelectasis in the lower lobes bilaterally. No effusions. Musculoskeletal: No acute bony abnormality. Review of the MIP images confirms the above findings. CTA ABDOMEN AND PELVIS FINDINGS VASCULAR Aorta: Normal caliber. Scattered minimal infrarenal calcifications. No dissection. Celiac: Widely patent SMA: Widely patent Renals: Single bilaterally, widely patent IMA: Widely patent Inflow: Normal caliber, no dissection. Veins: Unremarkable. Review of the MIP images confirms the above findings. NON-VASCULAR Hepatobiliary: No focal hepatic abnormality. Gallbladder unremarkable. Pancreas: No focal abnormality or ductal dilatation. Spleen: No focal abnormality.  Normal size. Adrenals/Urinary Tract: No adrenal abnormality. No focal renal abnormality. No stones or hydronephrosis. Urinary bladder is unremarkable. Stomach/Bowel: Mild gaseous distention of the stomach. Large and small bowel unremarkable. Lymphatic: No adenopathy. Reproductive: No visible focal abnormality. Other: No free fluid or free air. Moderate-sized left inguinal hernia containing fat.  Musculoskeletal: No acute bony abnormality. Degenerative disc disease at L4-5 with disc space narrowing and spurring. Review of the MIP images confirms the above findings. IMPRESSION: No evidence of aortic aneurysm or dissection. Coronary artery disease within the left anterior descending coronary artery. Cardiomegaly. Bibasilar and dependent atelectasis. No acute findings  in the abdomen or pelvis. Left inguinal hernia containing fat. Electronically Signed   By: Rolm Baptise M.D.   On: 11/24/2017 17:28   Ct Maxillofacial Wo Contrast  Result Date: 11/24/2017 CLINICAL DATA:  Intracranial abscess. EXAM: CT MAXILLOFACIAL WITHOUT CONTRAST TECHNIQUE: Multidetector CT imaging of the maxillofacial structures was performed. Multiplanar CT image reconstructions were also generated. COMPARISON:  11/04/2017 FINDINGS: Osseous: No acute finding.  Poor dentition. Orbits: Recent subperiosteal abscess drainage in the superior left orbit. Trace fluid was seen on preceding enhanced head CT. Left proptosis has significantly improved and is now mild. Sinuses: Interval left maxillary antrostomy and ethmoidectomies with opening of the frontal ethmoidal recess. Mucosal thickening still covers the left frontal sinus outflow. The antrostomy is widely patent, as is the middle meatus. The lamina papyracea appears to be intact. There is a area of imperceptibly thin fovea ethmoidalis measuring 3 mm in diameter at the level of the Crista galli. Anterior nasal septum perforation with adjacent edematous mucosa and debris. There is history of cocaine use. Small left maxillary sinus fluid level. Soft tissues: Intubation. Limited intracranial: Intracranial subdural collections and vasogenic edema, known from preceding head CT. IMPRESSION: 1. Interval endoscopic sinus surgery with widely patent left maxillary antrostomy and left ethmoidectomies. Mucosal thickening continues to obstruct left frontal sinus outflow. 2. Interval drainage of left  superior orbital abscess. Left proptosis has regressed and is now mild. No residual drainable fluid by preceding enhanced head CT. 3. 3 mm diameter area of imperceptibly thin fovea ethmoidalis on the left. Electronically Signed   By: Monte Fantasia M.D.   On: 11/24/2017 19:00    Medications:  Scheduled: . chlorhexidine gluconate (MEDLINE KIT)  15 mL Mouth Rinse BID  . feeding supplement (VITAL HIGH PROTEIN)  1,000 mL Per Tube Q24H  . mouth rinse  15 mL Mouth Rinse 10 times per day  . pantoprazole sodium  40 mg Per Tube Q1200   Continuous: . sodium chloride 50 mL/hr at 11/25/17 1323  . ceFEPime (MAXIPIME) IV 2 g (11/25/17 1337)  . fentaNYL infusion INTRAVENOUS 50 mcg/hr (11/24/17 2315)  . fentaNYL infusion INTRAVENOUS 100 mcg/hr (11/25/17 1340)  . levETIRAcetam 1,000 mg (11/25/17 1545)  . metronidazole 500 mg (11/25/17 1411)  . midazolam (VERSED) infusion Stopped (11/25/17 0927)  . valproate sodium 624 mg (11/25/17 1325)  . vancomycin Stopped (11/25/17 0745)   EEG without clear ongoing seizure activity. There is a prominent breech rhythm on th eright. There is diffuse slowing. Right sided sharp waves are intermittently seen.   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 He is now status post emergent bifrontal craniotomy with evacuation of abscess and subdural empyema with skull base reconstruction.   1. No electrographic seizures on LTM EEG review at the bedside.  2. Versed gtt currently at a rate of 5 mg/hr 3. On scheduled doses of Keppra and valproic acid.  4. The intermittent stereotyped RUE flexor posturing seen yesterday is now resolved.  5. On vancomycin, cefepime and metronidazole.  6. Post-operative CT head from today shows interval craniotomy with evacuation of subdural empyema and left frontal brain abscess. Residual edema remains, left greater than right with decreased mass effect and subfalcine  herniation. Cranialization of the frontal sinuses is also noted.   Recommendations: 1. Continue Keppra at 1000 mg IV BID 2. Continue valproic acid at 5 mg/kg IV TID 3. Continue IV ABX. Will defer to ID and ICU teams for dosing 4. A sample  of the empyema fluid as well as pus from the right frontal brain abscess have been sent to the lab by Neurosurgery. Appreciate Neurosurgery's input and surgical management 5. Continue LTM EEG until tomorrow morning. If no seizures off sedation, can discontinue LTM EEG.  6. Wean fully off Versed. If he becomes agitated and there are no seizures, a better choice for sedation from a Neurological standpoint would be fentanyl, in order to allow for discontinuation of LTM EEG.  35 minutes spent in the Neurological evaluation and management of this critically ill patient. Time spent included review of imaging and LTM EEG.    LOS: 1 day   '@Electronically'$  signed: Dr. Kerney Elbe 11/25/2017  4:08 PM

## 2017-11-25 NOTE — Brief Op Note (Signed)
11/25/2017  3:51 AM  PATIENT:  Ryan Bryan  42 y.o. male  PRE-OPERATIVE DIAGNOSIS:  Subdural empyema, status epilepticus, frontal sinusitis  POST-OPERATIVE DIAGNOSIS:  Subdural empyema, status epilepticus, frontal sinusitis, brain abscess  PROCEDURE:  Procedure(s): Bilateral Craniotomy for Infection Evacuation and Skull Base Defect Repair (Bilateral)  SURGEON:  Surgeon(s) and Role:    * Tahji Satartia, Clovis Pu, MD - Primary  ANESTHESIA:   general  EBL:  300 mL   BLOOD ADMINISTERED:none  DRAINS: none   LOCAL MEDICATIONS USED:  LIDOCAINE   SPECIMEN:  Cultures x2 - one in the epidural space / frontal sinus, 2nd from purulent material in left frontal intraparenchymal brain abscess  DISPOSITION OF SPECIMEN:  N/A  COUNTS:  YES  TOURNIQUET:  * No tourniquets in log *  DICTATION: .Note written in EPIC  PLAN OF CARE: Admit to inpatient   PATIENT DISPOSITION:  ICU - intubated and critically ill.   Delay start of Pharmacological VTE agent (>24hrs) due to surgical blood loss or risk of bleeding: yes

## 2017-11-25 NOTE — Op Note (Addendum)
PATIENT: Ryan Bryan  DAY OF SURGERY: 11/25/17   PRE-OPERATIVE DIAGNOSIS:  Subdural empyema, status epilepticus, frontal sinusitis   POST-OPERATIVE DIAGNOSIS:  Subdural empyema, status epilepticus, frontal sinusitis   PROCEDURE:  Bifrontal craniotomy, evacuation of bilateral subdural empyemas, exenteration and cranialization of frontal sinus, pericranial flap harvest   SURGEON:  Surgeon(s) and Role:    Jadene Pierini, MD - Primary  ANESTHESIA: ETGA   BRIEF HISTORY: This is a 42yo man who presented with status epilepticus. He previously had what appears to be a frontal sinus ductoplasty with a propel stent along with ophtho drainage of an intra-orbital abscess. He now presented with status epilepticus and was found to have an ethmoidal defect with sinusitis and intracranial extension with subdural empyema. This was discussed with the patient's daughter, who provided consent and stated that the patient would want surgical treatment if he was able to consent for himself.    OPERATIVE DETAIL: The patient was taken to the operating room and placed on the OR table in the supine position. A formal time out was performed with two patient identifiers and confirmed the operative site. Anesthesia was induced by the anesthesia team. The head was placed in a mayfield head holder and positioned. The operative site was marked, hair was clipped with surgical clippers, the area was then prepped and draped in a sterile fashion. A bicoronal incision was placed from tragus to tragus. The scalp was elevated with special attention to preserve the pericranium for a vascularized pedicle flap. A bifrontal craniotomy was performed in the standard fashion. The dura was elevated along the frontal bone and there was noted to be phlegmon in the frontal sinus, left greater than right. Further dissection revealed a connection to the frontal sinus as well as a defect in the ethmoids, larger on the left than the right. The  frontal sinus was exenterated with a diamond bit and cranialized by placing a bone graft into the os. The ethmoid defect was covered by a bone graft harvested from the cranial flap. Both bone grafts were then covered with autograft temporalis muscle and then the pericranial was rotated on its vascularized pedicle and placed on top. The repair was then secured in place using fibrin glue.   The dura was then opened bilaterally. On the right, a subdural empyema was encountered with copious amounts of purulent material, which was sent for culture. On the left, a defect in the dura was encountered, which connected to gliotic and edematous tissue, inside of which was a large intraparenchymal brain abscess that was drained and cultured. No further pockets of infection were visible. The wound was copiously irrigated, the bone flap was reapproximated using titanium plates and screws, all instrument and sponge counts were correct, the incision was then closed in layers. The patient was then returned to anesthesia for emergence. No apparent complications at the completion of the procedure.   EBL:    DRAINS: none   SPECIMENS: Culture x2 - epidural phlegmon and left frontal brain abscess   Jadene Pierini, MD 11/25/17 12:00 AM

## 2017-11-25 NOTE — Anesthesia Postprocedure Evaluation (Signed)
Anesthesia Post Note  Patient: Ryan Bryan  Procedure(s) Performed: Bilateral Craniotomy for Infection Evacuation and Skull Base Defect Repair (Bilateral Head)     Patient location during evaluation: SICU Anesthesia Type: General Level of consciousness: sedated Pain management: pain level controlled Vital Signs Assessment: post-procedure vital signs reviewed and stable Respiratory status: patient remains intubated per anesthesia plan Cardiovascular status: stable Postop Assessment: no apparent nausea or vomiting Anesthetic complications: no    Last Vitals:  Vitals:   11/24/17 2300 11/25/17 0415  BP: 105/65 (!) 141/73  Pulse: 72 (!) 112  Resp: (!) 26 (!) 31  Temp:    SpO2: 98% 96%    Last Pain:  Vitals:   11/24/17 1945  TempSrc: Axillary                 Shelton Silvas

## 2017-11-25 NOTE — Progress Notes (Signed)
NAME:  Ryan Bryan, MRN:  [id number], DOB:  November 25, 1975, LOS: 1 ADMISSION DATE:  11/24/2017, CONSULTATION DATE:  11/24/17 REFERRING MD: Salley Scarlet, EDP, CHIEF COMPLAINT: Status epilepticus  Brief History   42 year old with history of prior cocaine use, cardiac stent, recent eye surgery, sinus surgery for subperiorbital cellulitis at [facility] admitted with status epilepticus secondary to subdural empyema                              Past Medical History  Drug use, coronary artery disease s/p stent, periorbital cellulitis  Significant Hospital Events   10/2- 10/5- Admit to [facility] for left subperiorbital abscess. S/p FESS and orbitotomy by ENT and opthal. Notes in care everywhere  10/19- Admit to [facility] with status epilepticus, intubated in ED  Consults: date of consult/date signed off & final recs:  Neurology 10/19- Neurosurgery 10/19- ID 10/19-  Procedures (surgical and bedside):  ETT 10/19 > 10/20 Bilateral Craniotomy for Infection Evacuation and Skull Base Defect Repair (Bilateral)  Significant Diagnostic Tests:  CT head 10/19- subdural empyema is in the anterior cranial fossa, improved extra conal abscesses in left superior orbit, interval endoscopic sinus surgery, sinusitis.  CTA chest, abd, pelvis 10/19-basilar atelectasis, coronary artery disease in the left anterior descending artery.  I have reviewed the images personally.  Abscess Cx ([facility]) 11/09/17- Streptococcus constellatus (viridans Streptococcus) (A)  UDS + for BDZ, THC  Micro Data:  Blood cultures 10/19 >>  Antimicrobials:  Vanco 10/19 >> Flagyl 10/19 >> Rocephin 10/19 >>  Subjective:   He is critically ill, orally intubated on continuous EEG. Versed drip being tapered by neurology Low-grade febrile  Objective   Blood pressure (!) 147/92, pulse (!) 110, temperature 100 F (37.8 C), temperature source Axillary, resp. rate (!) 32, height 6\' 1"  (1.854 m), weight 124.7 kg, SpO2 97 %.    Vent Mode: PRVC FiO2  (%):  [40 %-100 %] 40 % Set Rate:  [18 bmp-32 bmp] 32 bmp Vt Set:  [470 mL-650 mL] 470 mL PEEP:  [5 cmH20] 5 cmH20 Plateau Pressure:  [11 cmH20-14 cmH20] 12 cmH20   Intake/Output Summary (Last 24 hours) at 11/25/2017 1203 Last data filed at 11/25/2017 1000 Gross per 24 hour  Intake 1188.13 ml  Output 1600 ml  Net -411.87 ml   Filed Weights   11/24/17 1543 11/25/17 0500  Weight: 124.7 kg 124.7 kg    Examination: Well-developed, well-nourished, sedated, orally intubated No pallor/icterus No JVD/edema Clear breath sounds bilateral, no rhonchi Soft nontender abdomen S1-S2 normal Pupils 4 mm bilaterally equally reactive to light, no meningeal signs  Chest x-ray personally reviewed, ET tube slightly high position, no new infiltrates  Resolved Hospital Problem list     Assessment & Plan:  Status epilepticus Versed being tapered by neurology based on LTM EEG Keppra and valproic to continue  Subdural empyema after recent eye, sinus surgery s/p drainage Strep constellatus infection at [facility] Neurosurgery and ID following Vanco, Flagyl, Rocephin per ID recommendation Await culture data  Acute respiratory failure secondary to seizures Ventilator settings reviewed and adjusted. Lower respiratory rate to 20 to decrease auto PEEP  Metabolic acidosis, likely elevated lactic acid from seizures -Resolved  Coronary artery disease status post stent Telemetry Resume Lopressor Holding outpt ASA,  losartan and gemfibrozil  Disposition / Summary of Today's Plan 11/25/17   Subdural collections have been evacuated, await cultures Versed being tapered by neurology, once off, can proceed with spontaneous breathing trial  and wake-up assessment if no seizures     Code Status: Full Family Communication: Daughter updated 10/19  Labs   CBC: Recent Labs  Lab 11/24/17 1440 11/25/17 0424  WBC 16.9* 15.5*  NEUTROABS 13.0*  --   HGB 15.1 12.3*  HCT 48.3 38.1*  MCV 92.4 87.4  PLT  565* 434*    Basic Metabolic Panel: Recent Labs  Lab 11/24/17 1440 11/25/17 0424  NA 136 139  K 5.0 4.9  CL 104 109  CO2 8* 20*  GLUCOSE 309* 115*  BUN 8 7  CREATININE 1.28* 0.83  CALCIUM 9.0 7.9*  MG  --  2.2  PHOS  --  4.4   GFR: Estimated Creatinine Clearance: 160.4 mL/min (by C-G formula based on SCr of 0.83 mg/dL). Recent Labs  Lab 11/24/17 1440 11/24/17 1915 11/24/17 2232 11/25/17 0424  WBC 16.9*  --   --  15.5*  LATICACIDVEN  --  2.9* 2.8*  --     Liver Function Tests: Recent Labs  Lab 11/24/17 1440  AST 40  ALT 24  ALKPHOS 84  BILITOT 0.7  PROT 7.7  ALBUMIN 3.8   No results for input(s): LIPASE, AMYLASE in the last 168 hours. Recent Labs  Lab 11/24/17 1440  AMMONIA 152*    ABG    Component Value Date/Time   PHART 7.381 11/25/2017 0550   PCO2ART 37.1 11/25/2017 0550   PO2ART 115 (H) 11/25/2017 0550   HCO3 21.5 11/25/2017 0550   TCO2 21 (L) 11/24/2017 1727   ACIDBASEDEF 2.8 (H) 11/25/2017 0550   O2SAT 98.3 11/25/2017 0550     Coagulation Profile: No results for input(s): INR, PROTIME in the last 168 hours.  Cardiac Enzymes: Recent Labs  Lab 11/24/17 1915 11/25/17 0424  TROPONINI 0.06* 0.06*    HbA1C: No results found for: HGBA1C  CBG: No results for input(s): GLUCAP in the last 168 hours.   My critical care time x 68m  Cyril Mourning MD. FCCP.  Pulmonary & Critical care Pager [phone] If no response call [phone]    11/25/2017, 12:03 PM

## 2017-11-25 NOTE — Progress Notes (Signed)
Patient transported to CT and back to 4N21 without complications. RN at bedside.

## 2017-11-25 NOTE — Consult Note (Signed)
[facility]       Reason for Consult: brain abscess    Referring Physician: Dr. Vaughan Browner  Active Problems:   Status epilepticus ([place])   . chlorhexidine gluconate (MEDLINE KIT)  15 mL Mouth Rinse BID  . feeding supplement (VITAL HIGH PROTEIN)  1,000 mL Per Tube Q24H  . mouth rinse  15 mL Mouth Rinse 10 times per day  . pantoprazole sodium  40 mg Per Tube Q1200    Recommendations: Vancomycin, cefepime, flagyl   Assessment: He has 3 areas of anterior cranial fossa subdural empyemas from extension from recent sinus disease now s/p emergent neurosurgical intervention.    Antibiotics: Vancomycin, cefepime, flagyl  HPI: Ryan Bryan is a 42 y.o. male with a history of CAD requiring stent placement, cocaine abuse who had trauma to his eye in September and a CT on 9/29 noted "extraconal hematoma with small locuation of air concerning for infection" per [facility] note and found to have orbital cellulitis and sinusitis of multiple sinues on left.  ENT did an endoscopic sinus decompression 10/2 and noted pus and on 10/4 ophthalmology did a left orbitotomy with evacuation of pus and culture did grow Strep viridans.    He was discharged on Augmentin, Bactrim and prednisone for 11 more days on 10/5.  He came in last night with status epilepticus and underwent emergent surgery by Dr. Zada Finders.     Review of Systems: Unobtainable due to patient factors   History reviewed. No pertinent past medical history. From chart review, not obtainable from the patient  Social History   Tobacco Use  . Smoking status: Not on file  Substance Use Topics  . Alcohol use: Not on file  . Drug use: Not on file   From chart review, not obtainable from the patient  History reviewed. No pertinent family history. From chart review, not obtainable from the patient  No Known Allergies From chart review, not obtainable from the patient  Physical Exam: Constitutional: NAD     Vitals:   11/25/17 1116 11/25/17 1200  BP: (!) 147/92   Pulse: (!) 110   Resp: (!) 32   Temp:  (!) 100.5 F (38.1 C)  SpO2: 97%    EYES: anicteric ENMT: +ET Cardiovascular: Cor Tachy Respiratory: breathing assisted on vent; coarse breath sounds anteriorly GI: Bowel sounds are normal Musculoskeletal: no pedal edema noted Skin: negatives: no rash Neuro: sedated, unresponsive Lines: peripheral  Lab Results  Component Value Date   WBC 15.5 (H) 11/25/2017   HGB 12.3 (L) 11/25/2017   HCT 38.1 (L) 11/25/2017   MCV 87.4 11/25/2017   PLT 434 (H) 11/25/2017    Lab Results  Component Value Date   CREATININE 0.83 11/25/2017   BUN 7 11/25/2017   NA 139 11/25/2017   K 4.9 11/25/2017   CL 109 11/25/2017   CO2 20 (L) 11/25/2017    Lab Results  Component Value Date   ALT 24 11/24/2017   AST 40 11/24/2017   ALKPHOS 84 11/24/2017     Microbiology: Recent Results (from the past 240 hour(s))  Culture, blood (routine x 2)     Status: None (Preliminary result)   Collection Time: 11/24/17  7:15 PM  Result Value Ref Range Status   Specimen Description BLOOD BLOOD RIGHT HAND  Final   Special Requests   Final    Blood Culture adequate volume BOTTLES DRAWN AEROBIC ONLY   Culture   Final    NO GROWTH <  24 HOURS Performed at [facility], [place] [address] [place], [place] [address]    Report Status PENDING  Incomplete  Culture, blood (routine x 2)     Status: None (Preliminary result)   Collection Time: 11/24/17  7:15 PM  Result Value Ref Range Status   Specimen Description BLOOD BLOOD RIGHT HAND  Final   Special Requests   Final    BOTTLES DRAWN AEROBIC ONLY Blood Culture adequate volume   Culture   Final    NO GROWTH < 24 HOURS Performed at [facility], [place] [address], [place], [place] [address]    Report Status PENDING  Incomplete  MRSA PCR Screening     Status: None   Collection Time: 11/24/17  7:51 PM  Result Value Ref Range Status   MRSA by PCR NEGATIVE  NEGATIVE Final    Comment:        The GeneXpert MRSA Assay (FDA approved for NASAL specimens only), is one component of a comprehensive MRSA colonization surveillance program. It is not intended to diagnose MRSA infection nor to guide or monitor treatment for MRSA infections. Performed at [facility], [place] [address] [place], [place] [address]   Aerobic/Anaerobic Culture (surgical/deep wound)     Status: None (Preliminary result)   Collection Time: 11/25/17  2:02 AM  Result Value Ref Range Status   Specimen Description ABSCESS  Final   Special Requests BRAIN ID A  Final   Gram Stain   Final    NO WBC SEEN NO ORGANISMS SEEN Performed at [facility], [place] [address], [place], [place] [address]    Culture PENDING  Incomplete   Report Status PENDING  Incomplete  Aerobic/Anaerobic Culture (surgical/deep wound)     Status: None (Preliminary result)   Collection Time: 11/25/17  2:46 AM  Result Value Ref Range Status   Specimen Description ABSCESS  Final   Special Requests BRAIN  ID B  Final   Gram Stain   Final    RARE WBC PRESENT, PREDOMINANTLY PMN NO ORGANISMS SEEN Performed at [facility], [place] [address], [place], [place] [address]    Culture PENDING  Incomplete   Report Status PENDING  Incomplete    Thayer Headings, [facility] [email or web address] [phone] pager  [phone] cell 11/25/2017, 1:58 PM

## 2017-11-25 NOTE — Progress Notes (Signed)
LTM EEG hooked up - pt had surgery so staples from t3-t4 through central  - push button tested

## 2017-11-25 NOTE — Progress Notes (Signed)
Initial EEG reviewed, no clear ongoing seizure activity, prominent breech rhythm.   Will reduce versed from 10mg /hr to 5mg /hr and continue to monitor.   Ritta Slot, MD [facility] [phone]  If 7pm- 7am, please page neurology on call as listed in AMION.

## 2017-11-26 ENCOUNTER — Inpatient Hospital Stay (HOSPITAL_COMMUNITY): Payer: Self-pay

## 2017-11-26 ENCOUNTER — Encounter (HOSPITAL_COMMUNITY): Payer: Self-pay | Admitting: Neurological Surgery

## 2017-11-26 DIAGNOSIS — Z978 Presence of other specified devices: Secondary | ICD-10-CM

## 2017-11-26 DIAGNOSIS — B9689 Other specified bacterial agents as the cause of diseases classified elsewhere: Secondary | ICD-10-CM

## 2017-11-26 DIAGNOSIS — Z9889 Other specified postprocedural states: Secondary | ICD-10-CM

## 2017-11-26 DIAGNOSIS — J9601 Acute respiratory failure with hypoxia: Secondary | ICD-10-CM

## 2017-11-26 LAB — CBC
HEMATOCRIT: 34.5 % — AB (ref 39.0–52.0)
Hemoglobin: 11 g/dL — ABNORMAL LOW (ref 13.0–17.0)
MCH: 28.6 pg (ref 26.0–34.0)
MCHC: 31.9 g/dL (ref 30.0–36.0)
MCV: 89.6 fL (ref 80.0–100.0)
Platelets: 298 10*3/uL (ref 150–400)
RBC: 3.85 MIL/uL — AB (ref 4.22–5.81)
RDW: 14.3 % (ref 11.5–15.5)
WBC: 12 10*3/uL — ABNORMAL HIGH (ref 4.0–10.5)
nRBC: 0 % (ref 0.0–0.2)

## 2017-11-26 LAB — BASIC METABOLIC PANEL
Anion gap: 8 (ref 5–15)
BUN: 5 mg/dL — AB (ref 6–20)
CO2: 26 mmol/L (ref 22–32)
Calcium: 8 mg/dL — ABNORMAL LOW (ref 8.9–10.3)
Chloride: 107 mmol/L (ref 98–111)
Creatinine, Ser: 0.75 mg/dL (ref 0.61–1.24)
GFR calc Af Amer: >60 mL/min (ref >60)
GFR calc non Af Amer: >60 mL/min (ref >60)
GLUCOSE: 114 mg/dL — AB (ref 70–99)
POTASSIUM: 4 mmol/L (ref 3.5–5.1)
Sodium: 141 mmol/L (ref 135–145)

## 2017-11-26 LAB — VALPROIC ACID LEVEL: Valproic Acid Lvl: 74 ug/mL (ref 50.0–100.0)

## 2017-11-26 LAB — GLUCOSE, CAPILLARY
GLUCOSE-CAPILLARY: 105 mg/dL — AB (ref 70–99)
GLUCOSE-CAPILLARY: 112 mg/dL — AB (ref 70–99)
Glucose-Capillary: 104 mg/dL — ABNORMAL HIGH (ref 70–99)
Glucose-Capillary: 107 mg/dL — ABNORMAL HIGH (ref 70–99)
Glucose-Capillary: 102 mg/dL — ABNORMAL HIGH (ref 70–99)
Glucose-Capillary: 110 mg/dL — ABNORMAL HIGH (ref 70–99)

## 2017-11-26 LAB — URINE CULTURE: Culture: NO GROWTH

## 2017-11-26 LAB — PHOSPHORUS: Phosphorus: 2.3 mg/dL — ABNORMAL LOW (ref 2.5–4.6)

## 2017-11-26 LAB — MAGNESIUM: Magnesium: 2.3 mg/dL (ref 1.7–2.4)

## 2017-11-26 MED ORDER — SODIUM CHLORIDE 0.9 % IV SOLN
2.0000 g | Freq: Three times a day (TID) | INTRAVENOUS | Status: DC
  Administered 2017-11-26 – 2017-11-28 (×5): 2 g via INTRAVENOUS
  Filled 2017-11-26 (×6): qty 2

## 2017-11-26 MED ORDER — VITAL HIGH PROTEIN PO LIQD
1000.0000 mL | ORAL | Status: DC
  Administered 2017-11-26 – 2017-11-28 (×3): 1000 mL

## 2017-11-26 MED ORDER — SODIUM CHLORIDE 0.9 % IV SOLN
200.0000 mg | INTRAVENOUS | Status: AC
  Administered 2017-11-26: 200 mg via INTRAVENOUS
  Filled 2017-11-26: qty 20

## 2017-11-26 MED ORDER — MIDAZOLAM BOLUS VIA INFUSION
10.0000 mg | Freq: Once | INTRAVENOUS | Status: AC
  Administered 2017-11-26: 10 mg via INTRAVENOUS
  Filled 2017-11-26: qty 10

## 2017-11-26 MED ORDER — SODIUM CHLORIDE 0.9 % IV SOLN
100.0000 mg | Freq: Two times a day (BID) | INTRAVENOUS | Status: DC
  Administered 2017-11-26 – 2017-11-30 (×10): 100 mg via INTRAVENOUS
  Filled 2017-11-26 (×13): qty 10

## 2017-11-26 MED ORDER — LORAZEPAM 2 MG/ML IJ SOLN
2.0000 mg | Freq: Once | INTRAMUSCULAR | Status: AC
  Administered 2017-11-26: 2 mg via INTRAVENOUS
  Filled 2017-11-26: qty 1

## 2017-11-26 MED ORDER — PRO-STAT SUGAR FREE PO LIQD
30.0000 mL | Freq: Three times a day (TID) | ORAL | Status: DC
  Administered 2017-11-26 – 2017-11-27 (×6): 30 mL
  Filled 2017-11-26 (×5): qty 30

## 2017-11-26 MED FILL — Thrombin (Recombinant) For Soln 20000 Unit: CUTANEOUS | Qty: 1 | Status: AC

## 2017-11-26 MED FILL — Thrombin (Recombinant) For Soln 20000 Unit: CUTANEOUS | Qty: 1 | Status: CN

## 2017-11-26 NOTE — Progress Notes (Addendum)
NAME:  Ryan Bryan, MRN:  [id number], DOB:  26-Apr-1975, LOS: 2 ADMISSION DATE:  11/24/2017, CONSULTATION DATE:  11/24/17 REFERRING MD: Salley Scarlet, EDP, CHIEF COMPLAINT: Status epilepticus  Brief History   42 year old with history of prior cocaine use, cardiac stent, recent eye surgery, sinus surgery for subperiorbital cellulitis at [facility] admitted with status epilepticus secondary to subdural empyema                              Past Medical History  Drug use, coronary artery disease s/p stent, periorbital cellulitis  Significant Hospital Events   10/2- 10/5- Admit to [facility] for left subperiorbital abscess. S/p FESS and orbitotomy by ENT and opthal. Notes in care everywhere 10/19- Admit to [facility] with status epilepticus, intubated in ED 10/21: Still with seizure overnight, Versed bolus and drip reinitiated, continuing full ventilator support until seizure activity subsides, still awaiting culture data  Consults: date of consult/date signed off & final recs:  Neurology 10/19- Neurosurgery 10/19- ID 10/19-  Procedures (surgical and bedside):  ETT 10/19 > 10/20 Bilateral Craniotomy for Infection Evacuation and Skull Base Defect Repair (Bilateral)  Significant Diagnostic Tests:  CT head 10/19- subdural empyema is in the anterior cranial fossa, improved extra conal abscesses in left superior orbit, interval endoscopic sinus surgery, sinusitis. CTA chest, abd, pelvis 10/19-basilar atelectasis, coronary artery disease in the left anterior descending artery.  I have reviewed the images personally. Abscess Cx ([facility]) 11/09/17- Streptococcus constellatus (viridans Streptococcus) (A) UDS + for BDZ, THC  Micro Data:  Blood cultures 10/19 >>  Antimicrobials:  Vanco 10/19 >> Flagyl 10/19 >> Rocephin 10/19 >>  Subjective:  Heavily sedated on versed gtt  Objective   Blood pressure 118/80, pulse 95, temperature 99.4 F (37.4 C), temperature source Axillary, resp. rate 20, height 6\' 1"  (1.854  m), weight 125 kg, SpO2 97 %.    Vent Mode: PRVC FiO2 (%):  [40 %] 40 % Set Rate:  [20 bmp-32 bmp] 20 bmp Vt Set:  [470 mL] 470 mL PEEP:  [5 cmH20] 5 cmH20 Plateau Pressure:  [10 cmH20-15 cmH20] 15 cmH20   Intake/Output Summary (Last 24 hours) at 11/26/2017 0919 Last data filed at 11/26/2017 0800 Gross per 24 hour  Intake 2616.86 ml  Output 2625 ml  Net -8.14 ml   Filed Weights   11/24/17 1543 11/25/17 0500 11/26/17 0500  Weight: 124.7 kg 124.7 kg 125 kg    Examination:  Well-developed well-nourished 42 year old white male heavily sedated on mechanical ventilation currently HEENT normocephalic atraumatic no jugular venous distention mucous membranes moist orally intubated.  EEG apparatus attached Pulmonary: Clear to auscultation equal chest rise on mechanically assisted breath Cardiac: Regular rate and rhythm no murmur rub or gallop Abdomen: Soft, nontender, no organomegaly, positive bowel sounds, tolerating tube feed GU: Clear yellow urine Extremities: Brisk capillary refill, strong pulses, no significant edema Neuro: Sedated, withdraws to pain, had been following commands prior to resuming Versed  Resolved Hospital Problem list   Lactic acidosis Anion gap metabolic acidosis  Assessment & Plan:   Status epilepticus Plan Continue Versed, with goal to stop seizure activity on EEG Continue EEG monitoring Continue anticonvulsants, deferring gas changes to neurology team   Subdural empyema after recent eye, sinus surgery s/p birfrontal craniotomy and drainage Strep constellatus infection at [facility] Cont vanc, flagyl and rocephin as directed by Infectious disease.  F/u cultures   Acute respiratory failure secondary to seizures and ineffective airway protection Portable  chest x-ray personally reviewed 10/21:Endotracheal tubes in satisfactory position, there is volume loss at the left base, and right basilar atelectasis Plan Continuing full ventilator support VAP  bundle Pulse oximetry Weaning once seizures resolved  Coronary artery disease status post stent Plan Continue telemetry monitoring No change in Lopressor Holding outpatient aspirin, losartan and gemfibrozil  Anemia of critical illness No evidence of bleeding Plan Trend CBC  At risk for fluid and electrolyte imbalance Plan Trend chemistry  Disposition / Summary of Today's Plan 11/26/17        Code Status: Full Family Communication: Daughter updated 10/19  Labs   CBC: Recent Labs  Lab 11/24/17 1440 11/25/17 0424 11/26/17 0405  WBC 16.9* 15.5* 12.0*  NEUTROABS 13.0*  --   --   HGB 15.1 12.3* 11.0*  HCT 48.3 38.1* 34.5*  MCV 92.4 87.4 89.6  PLT 565* 434* 298    Basic Metabolic Panel: Recent Labs  Lab 11/24/17 1440 11/25/17 0424 11/26/17 0405  NA 136 139 141  K 5.0 4.9 4.0  CL 104 109 107  CO2 8* 20* 26  GLUCOSE 309* 115* 114*  BUN 8 7 5*  CREATININE 1.28* 0.83 0.75  CALCIUM 9.0 7.9* 8.0*  MG  --  2.2 2.3  PHOS  --  4.4 2.3*   GFR: Estimated Creatinine Clearance: 166.6 mL/min (by C-G formula based on SCr of 0.75 mg/dL). Recent Labs  Lab 11/24/17 1440 11/24/17 1915 11/24/17 2232 11/25/17 0424 11/26/17 0405  WBC 16.9*  --   --  15.5* 12.0*  LATICACIDVEN  --  2.9* 2.8*  --   --     Liver Function Tests: Recent Labs  Lab 11/24/17 1440  AST 40  ALT 24  ALKPHOS 84  BILITOT 0.7  PROT 7.7  ALBUMIN 3.8   No results for input(s): LIPASE, AMYLASE in the last 168 hours. Recent Labs  Lab 11/24/17 1440  AMMONIA 152*    ABG    Component Value Date/Time   PHART 7.381 11/25/2017 0550   PCO2ART 37.1 11/25/2017 0550   PO2ART 115 (H) 11/25/2017 0550   HCO3 21.5 11/25/2017 0550   TCO2 21 (L) 11/24/2017 1727   ACIDBASEDEF 2.8 (H) 11/25/2017 0550   O2SAT 98.3 11/25/2017 0550     Coagulation Profile: No results for input(s): INR, PROTIME in the last 168 hours.  Cardiac Enzymes: Recent Labs  Lab 11/24/17 1915 11/25/17 0424  TROPONINI  0.06* 0.06*    HbA1C: No results found for: HGBA1C  CBG: Recent Labs  Lab 11/25/17 1536 11/25/17 1954 11/25/17 2326 11/26/17 0333 11/26/17 0749  GLUCAP 112* 116* 114* 104* 107*     Simonne Martinet ACNP-BC [facility] Pulmonary/Critical Care Pager # [phone] OR # [phone] if no answer  Attending Note:  42 year old male with history of sinus disease s/p surgical intervention who presents in status epilepticus due to CNS infection with abscesses s/p craniotomy that is now intubated for airway protection.  On exam, patient is sedate with clear lungs.  I reviewed CXR myself, ETT is in an acceptable position with no acute disease noted.  Discussed with PCCM-NP.   Subdural abscess: strep viridans on cultures from [facility] with our cultures NTD  - Vanc, cefepime and flagyl  - Appreciate input from ID  Status epilepticus   - Versed drip  - Neuro following  VDRF:  - Continue full vent support  - Begin weaning once seizures are under control and no further versed in needed  - VAP prevention  PCCM  will continue to follow  The patient is critically ill with multiple organ systems failure and requires high complexity decision making for assessment and support, frequent evaluation and titration of therapies, application of advanced monitoring technologies and extensive interpretation of multiple databases.   Critical Care Time devoted to patient care services described in this note is  35  Minutes. This time reflects time of care of this signee Dr Koren Bound. This critical care time does not reflect procedure time, or teaching time or supervisory time of PA/NP/Med student/Med Resident etc but could involve care discussion time.  Alyson Reedy, M.D. [facility] Pulmonary/Critical Care Medicine. Pager: [phone]. After hours pager: [phone].  11/26/2017, 9:19 AM

## 2017-11-26 NOTE — Procedures (Signed)
Electroencephalography report.  Long-term monitoring  Recording begins 11/25/2017 at 8:23 AM Recording ends to 11/26/2017 at 7:30 AM  CPT 95951 Day 1  Date acquisition: International 10-20 for electrodes placement.  18 channels EEG with additional EKG channel   This EEG was requested this patient to was found unresponsive with convulsions.  Patient is intubated.  Sedation status is not available  Background activities marked by background activity slowing in the delta range predominantly with a superimposed faster frequencies carrying sharply contoured morphology distributed paracentrally.  Occasionally paracentral frontal paracentral left and right sharp waves were present.  This possibly could represent breach rhythm in appropriate clinical setting.  However detailed history is not available  There is a one pushbutton activation event around 1127.  Reason for activation of event button is unclear.  There is no description provided.  There was no obvious clinical manifestations to this pushbutton activation events available reviewing the video monitoring.  EEG was accompanied by arousal   Clinical interpretation: This day 1 of intensive EEG monitoring with simultaneous video monitoring did not record any clinical subclinical seizures.  Background activities marked by predominantly delta slowing with superimposed faster frequencies scaring sharply contoured morphology distributed more anteriorly and paracentrally.  Admixed sharp waves in paracentral left and right region also present.  Could represent breach rhythm in appropriate clinical setting however detailed history is not available.  Pushbutton activation event was coinciding with arousal on EEG.  Clinical correlation is advised.

## 2017-11-26 NOTE — Progress Notes (Signed)
Initial Nutrition Assessment  DOCUMENTATION CODES:   Obesity unspecified  INTERVENTION:   Increase Vital High Protein to goal of 60 ml/hr via OG tube Add 30 ml Prostat TID  Provides: 1740 kcal, 171 grams of protein, and 1203 ml free water.    NUTRITION DIAGNOSIS:   Increased nutrient needs related to post-op healing as evidenced by estimated needs.  GOAL:   Provide needs based on ASPEN/SCCM guidelines  MONITOR:   TF tolerance, Vent status, I & O's  REASON FOR ASSESSMENT:   Consult, Ventilator Enteral/tube feeding initiation and management  ASSESSMENT:   Pt with PMH of prior cocaine abuse, cardiac stent, recent admit at [facility] 10/2 - 10/5 for sinus surgery for subperiorbital cellulitis now admitted 10/19 for status epilepticus secondary to subdural empyema. Pt is s/p bifrontal crani, evacuation of bilateral subdural empyemas, exenteration and cranialization of frontal sinus, and pericranial flap harvest 10/20.    Pt discussed during ICU rounds and with RN.  10/20 Vital High Protein @ 20 ml/hr via OG tube (tip in distal stomach)  Patient is currently intubated on ventilator support Temp (24hrs), Avg:100 F (37.8 C), Min:98.4 F (36.9 C), Max:101.3 F (38.5 C)  Medications reviewed Labs reviewed: PO4: 2.3 (L)    NUTRITION - FOCUSED PHYSICAL EXAM:    Most Recent Value  Orbital Region  No depletion  Upper Arm Region  No depletion  Thoracic and Lumbar Region  No depletion  Buccal Region  Unable to assess  Temple Region  No depletion  Clavicle Bone Region  Moderate depletion  Clavicle and Acromion Bone Region  No depletion  Scapular Bone Region  No depletion  Dorsal Hand  No depletion  Patellar Region  No depletion  Anterior Thigh Region  No depletion  Posterior Calf Region  No depletion  Edema (RD Assessment)  Mild  Hair  Reviewed  Eyes  Unable to assess  Mouth  Unable to assess  Skin  Reviewed  Nails  Reviewed       Diet Order:   Diet Order             Diet NPO time specified  Diet effective now              EDUCATION NEEDS:   No education needs have been identified at this time  Skin:  Skin Assessment: (head incision)  Last BM:  unknown  Height:   Ht Readings from Last 1 Encounters:  11/24/17 6\' 1"  (1.854 m)    Weight:   Wt Readings from Last 1 Encounters:  11/26/17 125 kg    Ideal Body Weight:  83.6 kg  BMI:  Body mass index is 36.36 kg/m.  Estimated Nutritional Needs:   Kcal:  1375-1750  Protein:  >/= 167 grams  Fluid:  > 2 L/day  Kendell Bane RD, LDN, CNSC [phone] Pager [phone] After Hours Pager

## 2017-11-26 NOTE — Progress Notes (Signed)
[name] for Infectious Disease  Date of Admission:  11/24/2017     Total days of antibiotics 3         ASSESSMENT/PLAN  Mr. Ryan Bryan was admitted with status epilepticus likely resulting from subdural empyema after recent surgery and requiring bilateral craniotomy for infection evacuation. Surgical cultures are without growth to date with no organisms noted on gram stain. Respiratory culture re-incubated and gram stain showing gram positive cocci. Previous cultures performed at [facility] did grow Streptococcus viridans. Remains sedated and on continuous EEG at present due to seizure overnight. On Day 2 of broad spectrum coverage with vancomycin, cefepime and metronidazole.   1. Continue current dose of vancomycin, cefepime and metronidazole narrowing as able. 2. Monitor fever curve, cultures, and WBC count. 3. Respiratory failure management per CCM. 4. Seizure control per neurology.    Active Problems:   Status epilepticus ([name])   . chlorhexidine gluconate (MEDLINE KIT)  15 mL Mouth Rinse BID  . feeding supplement (PRO-STAT SUGAR FREE 64)  30 mL Per Tube TID  . mouth rinse  15 mL Mouth Rinse 10 times per day  . pantoprazole sodium  40 mg Per Tube Q1200    SUBJECTIVE:  Febrile with max temperature of 101.3 in the last 24 hours with stable leukocytosis. POD #2 from bilateral craniotomy for infection evacuation. Remains sedated and on continuous EEG monitoring. Respiratory culture pending with gram stain showing gram positive cocci. Surgical cultures without growth to date and no organisms on gram stain.   No Known Allergies   Review of Systems: Review of Systems  Unable to perform ROS: Intubated    OBJECTIVE: Vitals:   11/26/17 0800 11/26/17 1000 11/26/17 1123 11/26/17 1200  BP: 118/80 118/75 128/78   Pulse: 95 92 94   Resp: _0 Temp: 99.4 F (37.4 C)   98.4 F (36.9 C)  TempSrc: Axillary   Axillary  SpO2: 97% 97% 97%   Weight:      Height:        Body mass index is 36.36 kg/m.  Physical Exam  Constitutional: He appears well-developed and well-nourished. He has a sickly appearance. He appears ill. No distress. He is sedated and intubated.  Lying in bed with head of bed elevated; sedated.   HENT:  Surgical site closed with stables and appears clean and dry with no odor or drainage and without evidence of infection.   Cardiovascular: Normal rate, regular rhythm, normal heart sounds and intact distal pulses. Exam reveals no friction rub.  No murmur heard. Arterial line present and without evidence of infection.   Pulmonary/Chest: Effort normal and breath sounds normal. No stridor. He is intubated. No respiratory distress. He has no wheezes. He has no rales.  Abdominal: Soft. Bowel sounds are normal. He exhibits no distension. There is no tenderness. There is no guarding.  Musculoskeletal:  Right had with fingers unable to be extended at the PIP joint.   Skin: Skin is warm and dry. No rash noted.    Lab Results Lab Results  Component Value Date   WBC 12.0 (H) 11/26/2017   HGB 11.0 (L) 11/26/2017   HCT 34.5 (L) 11/26/2017   MCV 89.6 11/26/2017   PLT 298 11/26/2017    Lab Results  Component Value Date   CREATININE 0.75 11/26/2017   BUN 5 (L) 11/26/2017   NA 141 11/26/2017   K 4.0 11/26/2017   CL 107 11/26/2017   CO2 26 11/26/2017  Lab Results  Component Value Date   ALT 24 11/24/2017   AST 40 11/24/2017   ALKPHOS 84 11/24/2017   BILITOT 0.7 11/24/2017     Microbiology: Recent Results (from the past 240 hour(s))  Culture, blood (routine x 2)     Status: None (Preliminary result)   Collection Time: 11/24/17  7:15 PM  Result Value Ref Range Status   Specimen Description BLOOD BLOOD RIGHT HAND  Final   Special Requests   Final    Blood Culture adequate volume BOTTLES DRAWN AEROBIC ONLY   Culture   Final    NO GROWTH 2 DAYS Performed at [facility], [place] [address], [place], [place] [address]    Report  Status PENDING  Incomplete  Culture, blood (routine x 2)     Status: None (Preliminary result)   Collection Time: 11/24/17  7:15 PM  Result Value Ref Range Status   Specimen Description BLOOD BLOOD RIGHT HAND  Final   Special Requests   Final    BOTTLES DRAWN AEROBIC ONLY Blood Culture adequate volume   Culture   Final    NO GROWTH 2 DAYS Performed at [facility], [place] [address], [place], [place] [address]    Report Status PENDING  Incomplete  MRSA PCR Screening     Status: None   Collection Time: 11/24/17  7:51 PM  Result Value Ref Range Status   MRSA by PCR NEGATIVE NEGATIVE Final    Comment:        The GeneXpert MRSA Assay (FDA approved for NASAL specimens only), is one component of a comprehensive MRSA colonization surveillance program. It is not intended to diagnose MRSA infection nor to guide or monitor treatment for MRSA infections. Performed at [facility], [place] [address] [place], [place] [address]   Aerobic/Anaerobic Culture (surgical/deep wound)     Status: None (Preliminary result)   Collection Time: 11/25/17  2:02 AM  Result Value Ref Range Status   Specimen Description ABSCESS  Final   Special Requests BRAIN ID A  Final   Gram Stain NO WBC SEEN NO ORGANISMS SEEN   Final   Culture   Final    NO GROWTH 1 DAY Performed at [facility], [place] [address] [place], [place] [address]    Report Status PENDING  Incomplete  Aerobic/Anaerobic Culture (surgical/deep wound)     Status: None (Preliminary result)   Collection Time: 11/25/17  2:46 AM  Result Value Ref Range Status   Specimen Description ABSCESS  Final   Special Requests BRAIN  ID B  Final   Gram Stain   Final    RARE WBC PRESENT, PREDOMINANTLY PMN NO ORGANISMS SEEN    Culture   Final    NO GROWTH 1 DAY Performed at [facility], [place] [address], [place], [place] [address]    Report Status PENDING  Incomplete  Urine culture     Status: None   Collection Time: 11/25/17  6:01  AM  Result Value Ref Range Status   Specimen Description URINE, CATHETERIZED  Final   Special Requests NONE  Final   Culture   Final    NO GROWTH Performed at [facility], [place] [address] [place], [place] [address]    Report Status 11/26/2017 FINAL  Final  Culture, respiratory (tracheal aspirate)     Status: None (Preliminary result)   Collection Time: 11/25/17  8:27 AM  Result Value Ref Range Status  Specimen Description TRACHEAL ASPIRATE  Final   Special Requests NONE  Final   Gram Stain   Final    RARE WBC PRESENT,BOTH PMN AND MONONUCLEAR FEW GRAM POSITIVE COCCI IN CLUSTERS    Culture   Final    CULTURE REINCUBATED FOR BETTER GROWTH Performed at [facility], [place] [address] [place], [place] [address]    Report Status PENDING  Incomplete     Terri Piedra, [place] [facility] [place] Pager  11/26/2017  1:04 PM

## 2017-11-26 NOTE — Progress Notes (Signed)
Reason for consult:   Subjective: Patient is intubated and sedated.  Patient had right upper extremity rhythmic jerking yesterday concerning for seizures. Received additional valproic acid overnight as well as lacosamide.  Patient started on Versed as well.    ROS: Unable to obtain as patient sedated    Examination  Vital signs in last 24 hours: Temp:  [99.4 F (37.4 C)-101.3 F (38.5 C)] 99.4 F (37.4 C) (10/21 0800) Pulse Rate:  [95-114] 95 (10/21 0800) Resp:  [13-32] 20 (10/21 0800) BP: (109-152)/(61-98) 118/80 (10/21 0800) SpO2:  [95 %-100 %] 97 % (10/21 0800) Arterial Line BP: (129-156)/(58-70) 133/60 (10/21 0800) FiO2 (%):  [40 %] 40 % (10/21 0800) Weight:  [161 kg] 125 kg (10/21 0500)  General: lying in bed, intubated CVS: pulse-normal rate and rhythm RS: breathing comfortably Extremities: normal   Neuro: Mental Status: Patient does not respond to verbal stimuli.  Does not respond to deep sternal rub.  Does not follow commands.  No verbalizations noted.  Cranial Nerves: II: pupils 3mm bilaterally III,IV,VI: doll's response absent bilaterally.  V,VII: corneal reflex: present VIII: patient does not respond to verbal stimuli IX,X: gag reflex present Motor: Withdraws minimally in all 4 extremities Sensory: Unable to assess Plantars:  mute Cerebellar: Unable to perform  Basic Metabolic Panel: Recent Labs  Lab 11/24/17 1440 11/25/17 0424 11/26/17 0405  NA 136 139 141  K 5.0 4.9 4.0  CL 104 109 107  CO2 8* 20* 26  GLUCOSE 309* 115* 114*  BUN 8 7 5*  CREATININE 1.28* 0.83 0.75  CALCIUM 9.0 7.9* 8.0*  MG  --  2.2 2.3  PHOS  --  4.4 2.3*    CBC: Recent Labs  Lab 11/24/17 1440 11/25/17 0424 11/26/17 0405  WBC 16.9* 15.5* 12.0*  NEUTROABS 13.0*  --   --   HGB 15.1 12.3* 11.0*  HCT 48.3 38.1* 34.5*  MCV 92.4 87.4 89.6  PLT 565* 434* 298     Coagulation Studies: No results for input(s): LABPROT, INR in the last 72 hours.  Reviewed patient's  MRI and CT images.  Bilateral frontal hypodensities on CT consistent with patient's abscess/edema.  EEG: This day 1 of intensive EEG monitoring with simultaneous video monitoring did not record any clinical subclinical seizures.  Background activities marked by predominantly delta slowing with superimposed faster frequencies scaring sharply contoured morphology distributed more anteriorly and paracentrally.  Admixed sharp waves in paracentral left and right region also present.  Could represent breach rhythm in appropriate clinical setting however detailed history is not available.  Pushbutton activation event was coinciding with arousal on EEG.  Clinical correlation is advised.  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 He is now status post emergent bifrontal craniotomy with evacuation of abscess and subdural empyema with skull base reconstruction.    Continue to have right upper extremity twitching, not correlating with EEG.  However simple partial status can be missed on surface EEG and patient treated with Vimpat and valproic acid load ( already on VPA and Keppra).  Also started on midozalam drip.  Currently no longer having any jerking/rhythmic movements  Partial status epilepticus  Plan Continue current AEDs-Keppra, Vimpat, valproic acid Follow daily VPA levels Continue LTM EEG Gradually taper off Versed Correct metabolic and electrolyte disturbances, continue to treat infection   This patient is neurologically critically ill due to partial  status epilepticus and is of neurological worsening from worsening seizures, complications  of intubation and ICU stay, respiratory failure and sepsis.  This patient's care requires constant monitoring of vital signs, hemodynamics, respiratory and cardiac monitoring, review of multiple databases, neurological assessment, discussion with family, other specialists and  medical decision making of high complexity.  I spent 35  minutes of neurocritical time in the care of this patient.     Georgiana Spinner Jasman Pfeifle [facility] Pager Number [phone] For questions after 7pm please refer to AMION to reach the Neurologist on call

## 2017-11-26 NOTE — Progress Notes (Signed)
PHARMACY NOTE:  ANTIMICROBIAL RENAL DOSAGE ADJUSTMENT  Current antimicrobial regimen includes a mismatch between antimicrobial dosage and estimated renal function.  As per policy approved by the Pharmacy & Therapeutics and Medical Executive Committees, the antimicrobial dosage will be adjusted accordingly.  Current antimicrobial dosage:  Cefepime 2 gm Q 12 hours  Indication: Brain abscess  Renal Function:  Estimated Creatinine Clearance: 166.6 mL/min (by C-G formula based on SCr of 0.75 mg/dL). []      On intermittent HD, scheduled: []      On CRRT     Antimicrobial dosage has been changed to:  Cefepime 2 gm Q 8 hours   Additional comments: Increased for CNS penetration   Thank you for allowing pharmacy to be a part of this patient's care.  Della Goo, [facility] , PharmD, BCPS Infectious Diseases Clinical Pharmacist Phone: [phone] 11/26/2017 3:36 PM

## 2017-11-26 NOTE — Progress Notes (Signed)
LTM EEG maintenance completed. Fixed leads PRN. No skin break down under FP1, C3, O1.

## 2017-11-27 DIAGNOSIS — L03213 Periorbital cellulitis: Secondary | ICD-10-CM

## 2017-11-27 DIAGNOSIS — G9340 Encephalopathy, unspecified: Secondary | ICD-10-CM

## 2017-11-27 DIAGNOSIS — G062 Extradural and subdural abscess, unspecified: Secondary | ICD-10-CM

## 2017-11-27 LAB — BASIC METABOLIC PANEL
Anion gap: 5 (ref 5–15)
BUN: 5 mg/dL — ABNORMAL LOW (ref 6–20)
CALCIUM: 7.9 mg/dL — AB (ref 8.9–10.3)
CO2: 30 mmol/L (ref 22–32)
Chloride: 107 mmol/L (ref 98–111)
Creatinine, Ser: 0.58 mg/dL — ABNORMAL LOW (ref 0.61–1.24)
GFR calc Af Amer: >60 mL/min (ref >60)
GLUCOSE: 112 mg/dL — AB (ref 70–99)
Potassium: 3.3 mmol/L — ABNORMAL LOW (ref 3.5–5.1)
Sodium: 142 mmol/L (ref 135–145)

## 2017-11-27 LAB — HIV ANTIBODY (ROUTINE TESTING W REFLEX): HIV SCREEN 4TH GENERATION: NONREACTIVE

## 2017-11-27 LAB — GLUCOSE, CAPILLARY
GLUCOSE-CAPILLARY: 104 mg/dL — AB (ref 70–99)
GLUCOSE-CAPILLARY: 98 mg/dL (ref 70–99)
GLUCOSE-CAPILLARY: 99 mg/dL (ref 70–99)
Glucose-Capillary: 113 mg/dL — ABNORMAL HIGH (ref 70–99)
Glucose-Capillary: 84 mg/dL (ref 70–99)
Glucose-Capillary: 99 mg/dL (ref 70–99)

## 2017-11-27 LAB — CULTURE, RESPIRATORY W GRAM STAIN: Culture: NORMAL

## 2017-11-27 LAB — VANCOMYCIN, TROUGH

## 2017-11-27 LAB — CULTURE, RESPIRATORY

## 2017-11-27 LAB — TRIGLYCERIDES: TRIGLYCERIDES: 110 mg/dL (ref <150)

## 2017-11-27 MED ORDER — PROPOFOL 1000 MG/100ML IV EMUL
5.0000 ug/kg/min | INTRAVENOUS | Status: DC
  Administered 2017-11-27: 40 ug/kg/min via INTRAVENOUS
  Administered 2017-11-27: 25 ug/kg/min via INTRAVENOUS
  Administered 2017-11-28: 40 ug/kg/min via INTRAVENOUS
  Administered 2017-11-28: 35 ug/kg/min via INTRAVENOUS
  Filled 2017-11-27 (×3): qty 100

## 2017-11-27 MED ORDER — PROPOFOL 1000 MG/100ML IV EMUL
INTRAVENOUS | Status: AC
  Filled 2017-11-27: qty 100

## 2017-11-27 MED ORDER — POLYETHYLENE GLYCOL 3350 17 G PO PACK
17.0000 g | PACK | Freq: Every day | ORAL | Status: DC
  Administered 2017-11-27: 17 g via ORAL
  Filled 2017-11-27: qty 1

## 2017-11-27 MED ORDER — SODIUM CHLORIDE 0.9 % IV SOLN
INTRAVENOUS | Status: DC | PRN
  Administered 2017-11-27: 500 mL via INTRAVENOUS

## 2017-11-27 MED ORDER — VANCOMYCIN HCL 10 G IV SOLR
1250.0000 mg | Freq: Three times a day (TID) | INTRAVENOUS | Status: DC
  Administered 2017-11-27 – 2017-11-29 (×6): 1250 mg via INTRAVENOUS
  Filled 2017-11-27 (×6): qty 1250

## 2017-11-27 MED ORDER — VANCOMYCIN HCL IN DEXTROSE 1-5 GM/200ML-% IV SOLN
1000.0000 mg | Freq: Once | INTRAVENOUS | Status: AC
  Administered 2017-11-27: 1000 mg via INTRAVENOUS
  Filled 2017-11-27: qty 200

## 2017-11-27 MED ORDER — SENNOSIDES 8.8 MG/5ML PO SYRP
15.0000 mL | ORAL_SOLUTION | Freq: Two times a day (BID) | ORAL | Status: DC
  Administered 2017-11-27: 15 mL
  Filled 2017-11-27 (×2): qty 15

## 2017-11-27 NOTE — Consult Note (Addendum)
Reason for consult: Status epilepticus  Subjective: No right arm twitching yesterday.   ROS: Unable to obtain due to poor mental status Examination  Vital signs in last 24 hours: Temp:  [98.9 F (37.2 C)-100 F (37.8 C)] 99.7 F (37.6 C) (10/22 1600) Pulse Rate:  [87-108] 108 (10/22 1800) Resp:  [11-22] 13 (10/22 1800) BP: (120-161)/(64-81) 161/81 (10/22 1800) SpO2:  [95 %-99 %] 95 % (10/22 1800) Arterial Line BP: (76-172)/(51-67) 172/67 (10/22 1800) FiO2 (%):  [40 %] 40 % (10/22 1539) Weight:  [119.8 kg] 119.8 kg (10/22 0353)  General: lying in bed, intubated CVS: pulse-normal rate and rhythm RS: breathing comfortably Extremities: normal   Neuro: Patient intubated and on sedation Non -Verbal and not following any commands Pupils are equal and reactive, doll's reflex present, corneal reflex present, gag reflex present Motor: Withdraws in all 4 extremities equally Reflexes: 1+ bilaterally over bilateral patella, plantars are downgoing   Basic Metabolic Panel: Recent Labs  Lab 11/24/17 1440 11/25/17 0424 11/26/17 0405 11/27/17 1049  NA 136 139 141 142  K 5.0 4.9 4.0 3.3*  CL 104 109 107 107  CO2 8* 20* 26 30  GLUCOSE 309* 115* 114* 112*  BUN 8 7 5* 5*  CREATININE 1.28* 0.83 0.75 0.58*  CALCIUM 9.0 7.9* 8.0* 7.9*  MG  --  2.2 2.3  --   PHOS  --  4.4 2.3*  --     CBC: Recent Labs  Lab 11/24/17 1440 11/25/17 0424 11/26/17 0405  WBC 16.9* 15.5* 12.0*  NEUTROABS 13.0*  --   --   HGB 15.1 12.3* 11.0*  HCT 48.3 38.1* 34.5*  MCV 92.4 87.4 89.6  PLT 565* 434* 298     Coagulation Studies: No results for input(s): LABPROT, INR in the last 72 hours.   Clinical interpretation: This day 2 of intensive EEG monitoring with simultaneous video monitoring did not record any clinical or subclinical seizures. Background activities marked by predominantly delta slowing with superimposed faster frequencies with  sharply contoured morphology distributed more anteriorly  and paracentrally. Admixed sharp waves in paracentral left and right region also present. Could represent breach rhythm in appropriate clinical setting however detailed history is not available.  Possibly right frontocentral spikes noted may be suggestive of cortical irritability in that region.  Persistent electrode artifact as discussed above.Pushbutton activation event was coinciding with arousal on EEG. Clinical correlation is advised.     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 He is now status post emergent bifrontal craniotomy with evacuation of abscess and subdural empyema with skull base reconstruction.  Continue to have right upper extremity twitching, not correlating with EEG.  However simple partial status can be missed on surface EEG and patient treated with Vimpat and valproic acid load ( already on VPA and Keppra).  Also started on midozalam drip.  Currently no longer having any jerking/rhythmic movements  Partial status epilepticus - resolved  Plan Wean sedation ( gradually taper Versed) Continue current AEDs-Keppra, Vimpat, valproic acid Follow daily VPA levels D/C LTM EEG tomorrow  Gradually taper off Versed Correct metabolic and electrolyte disturbances, continue to treat infection   Addendum  Reviewed EEG at 6.30 PM which showed no seizure-like activity.   Spoke to his nurse, patient awake and following commands.     This patient is neurologically critically ill due to partial status epilepticus and is of neurological worsening from worsening seizures, complications of intubation and  ICU stay, respiratory failure and sepsis.  This patient's care requires constant monitoring of vital signs, hemodynamics, respiratory and cardiac monitoring, review of multiple databases, neurological assessment, discussion with family, other specialists and medical decision making of  high complexity.  I spent  30 minutes of neurocritical time in the care of this patient.    Georgiana Spinner Aroor [facility] Pager Number [phone] For questions after 7pm please refer to AMION to reach the Neurologist on call

## 2017-11-27 NOTE — Progress Notes (Addendum)
NAME:  Ryan Bryan, MRN:  [id number], DOB:  [birth year], LOS: 3 ADMISSION DATE:  11/24/2017, CONSULTATION DATE:  11/24/17 REFERRING MD: Salley Scarlet, EDP, CHIEF COMPLAINT: Status epilepticus  Brief History   42 year old with history of prior cocaine use, cardiac stent, recent eye surgery, sinus surgery for subperiorbital cellulitis at [facility] admitted with status epilepticus secondary to subdural empyema                              Past Medical History  Drug use, coronary artery disease s/p stent, periorbital cellulitis  Significant Hospital Events   10/2- 10/5- Admit to [facility] for left subperiorbital abscess. S/p FESS and orbitotomy by ENT and opthal. Notes in care everywhere 10/19- Admit to [facility] with status epilepticus, intubated in ED 10/21: Still with seizure overnight, Versed bolus and drip reinitiated, continuing full ventilator support until seizure activity subsides, still awaiting culture data 10/22: Starting to wean Versed, no obvious seizure activity currently Consults: date of consult/date signed off & final recs:  Neurology 10/19- Neurosurgery 10/19- ID 10/19-  Procedures (surgical and bedside):  ETT 10/19 > 10/20 Bilateral Craniotomy for Infection Evacuation and Skull Base Defect Repair (Bilateral)  Significant Diagnostic Tests:  CT head 10/19- subdural empyema is in the anterior cranial fossa, improved extra conal abscesses in left superior orbit, interval endoscopic sinus surgery, sinusitis. CTA chest, abd, pelvis 10/19-basilar atelectasis, coronary artery disease in the left anterior descending artery.  I have reviewed the images personally. Abscess Cx ([facility]) 11/09/17- Streptococcus constellatus (viridans Streptococcus) (A) UDS + for BDZ, THC  Micro Data:  Blood cultures 10/19 >> Sputum culture 10/19 Antimicrobials:  Vanco 10/19 >> Flagyl 10/19 >> Cefepime 10/19 >>  Subjective:  Heavily sedated on versed gtt  Objective   Blood pressure 135/76, pulse 90,  temperature 100 F (37.8 C), temperature source Axillary, resp. rate 20, height 6\' 1"  (1.854 m), weight 119.8 kg, SpO2 97 %.    Vent Mode: PRVC FiO2 (%):  [40 %] 40 % Set Rate:  [20 bmp] 20 bmp Vt Set:  [470 mL] 470 mL PEEP:  [5 cmH20] 5 cmH20 Plateau Pressure:  [13 cmH20-16 cmH20] 13 cmH20   Intake/Output Summary (Last 24 hours) at 11/27/2017 1016 Last data filed at 11/27/2017 0900 Gross per 24 hour  Intake 9486.2 ml  Output 1325 ml  Net 8161.2 ml   Filed Weights   11/25/17 0500 11/26/17 0500 11/27/17 0353  Weight: 124.7 kg 125 kg 119.8 kg    Examination: General: 42 year old male currently sedated on Versed drip HEENT craniotomy staples intact, mucous membranes moist.  Orally intubated bruising over eyes Pulmonary: Clear to auscultation equal chest rise on mechanically assisted breath Cardiac: Regular rate and rhythm Abdomen: Soft nontender no organomegaly Extremities: Brisk cap refill no edema strong pulses Neuro: Opens eyes to verbal command, tries to follow commands, no obvious focal deficits GU: Clear yellow  Resolved Hospital Problem list   Lactic acidosis Anion gap metabolic acidosis  Assessment & Plan:   Status epilepticus Plan Continued EEG monitoring  Weaning Versed  Anticonvulsants per neurology  Subdural empyema after recent eye, sinus surgery s/p birfrontal craniotomy and drainage Strep constellatus infection at [facility] Continuing vancomycin, Flagyl, and cefepime as directed by infectious disease  Post op care per N-surg Serial neuro checks   Acute respiratory failure secondary to seizures and ineffective airway protection Portable chest x-ray personally reviewed 10/21:Endotracheal tubes in satisfactory position, there is volume loss at the  left base, and right basilar atelectasis Plan Continuing full ventilator support for now Anticipate spontaneous breathing trial once off sedation VAP bundle Follow-up pending sputum cultures  Coronary  artery disease status post stent Plan Continue telemetry monitoring No change in Lopressor Cont asa, losartan and gemfibrozil   Anemia of critical illness No evidence of bleeding Plan A.m. CBC  At risk for fluid and electrolyte imbalance Plan Continue to trend chemistry  Disposition / Summary of Today's Plan 11/27/17   Weaning versed.. Hope to start weaning vent once MS allows      Code Status: Full Family Communication: Daughter updated 10/19  Labs   CBC: Recent Labs  Lab 11/24/17 1440 11/25/17 0424 11/26/17 0405  WBC 16.9* 15.5* 12.0*  NEUTROABS 13.0*  --   --   HGB 15.1 12.3* 11.0*  HCT 48.3 38.1* 34.5*  MCV 92.4 87.4 89.6  PLT 565* 434* 298    Basic Metabolic Panel: Recent Labs  Lab 11/24/17 1440 11/25/17 0424 11/26/17 0405  NA 136 139 141  K 5.0 4.9 4.0  CL 104 109 107  CO2 8* 20* 26  GLUCOSE 309* 115* 114*  BUN 8 7 5*  CREATININE 1.28* 0.83 0.75  CALCIUM 9.0 7.9* 8.0*  MG  --  2.2 2.3  PHOS  --  4.4 2.3*   GFR: Estimated Creatinine Clearance: 163.2 mL/min (by C-G formula based on SCr of 0.75 mg/dL). Recent Labs  Lab 11/24/17 1440 11/24/17 1915 11/24/17 2232 11/25/17 0424 11/26/17 0405  WBC 16.9*  --   --  15.5* 12.0*  LATICACIDVEN  --  2.9* 2.8*  --   --     Liver Function Tests: Recent Labs  Lab 11/24/17 1440  AST 40  ALT 24  ALKPHOS 84  BILITOT 0.7  PROT 7.7  ALBUMIN 3.8   No results for input(s): LIPASE, AMYLASE in the last 168 hours. Recent Labs  Lab 11/24/17 1440  AMMONIA 152*    ABG    Component Value Date/Time   PHART 7.381 11/25/2017 0550   PCO2ART 37.1 11/25/2017 0550   PO2ART 115 (H) 11/25/2017 0550   HCO3 21.5 11/25/2017 0550   TCO2 21 (L) 11/24/2017 1727   ACIDBASEDEF 2.8 (H) 11/25/2017 0550   O2SAT 98.3 11/25/2017 0550     Coagulation Profile: No results for input(s): INR, PROTIME in the last 168 hours.  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  Patient is starting to wake up more at this point with clear lungs on exam.  I reviewed his CXR myself, no acute disease noted.  Discussed with PCCM-NP.  Will decrease sedation.  Attempt weaning trials.  Will attempt to avoid tracheostomy pending mental status changes.  Will evaluate towards the end of the week.  Continue abx as ordered.  PCCM will continue to follow.  The patient is critically ill with multiple organ systems failure and requires high complexity decision making for assessment and support, frequent evaluation and titration of therapies, application of advanced monitoring technologies and extensive interpretation of multiple databases.  Critical Care Time devoted to patient care services described in this note is  33  Minutes. This time reflects time of care of this signee Dr Koren Bound. This critical care time does not reflect procedure time, or teaching time or supervisory time of PA/NP/Med student/Med Resident etc but could involve care discussion time.  Alyson Reedy, M.D. [facility] Pulmonary/Critical Care Medicine. Pager: [phone]. After hours pager: [phone].  11/27/2017, 10:16 AM

## 2017-11-27 NOTE — Progress Notes (Signed)
Neurosurgery Service Progress Note  Subjective: No acute events overnight, still on heavy sedation for status epilepticus   Objective: Vitals:   11/27/17 0700 11/27/17 0800 11/27/17 0805 11/27/17 0806  BP: 120/66 129/69 129/69   Pulse: 89 93 94   Resp: _0 Temp:  100 F (37.8 C)    TempSrc:  Axillary    SpO2: 97% 97% 97% 97%  Weight:      Height:       Temp (24hrs), Avg:99.2 F (37.3 C), Min:98.4 F (36.9 C), Max:100 F (37.8 C)  CBC Latest Ref Rng & Units 11/26/2017 11/25/2017 11/24/2017  WBC 4.0 - 10.5 K/uL 12.0(H) 15.5(H) 16.9(H)  Hemoglobin 13.0 - 17.0 g/dL 11.0(L) 12.3(L) 15.1  Hematocrit 39.0 - 52.0 % 34.5(L) 38.1(L) 48.3  Platelets 150 - 400 K/uL 298 434(H) 565(H)   BMP Latest Ref Rng & Units 11/26/2017 11/25/2017 11/24/2017  Glucose 70 - 99 mg/dL 114(H) 115(H) 309(H)  BUN 6 - 20 mg/dL 5(L) 7 8  Creatinine 0.61 - 1.24 mg/dL 0.75 0.83 1.28(H)  Sodium 135 - 145 mmol/L 141 139 136  Potassium 3.5 - 5.1 mmol/L 4.0 4.9 5.0  Chloride 98 - 111 mmol/L 107 109 104  CO2 22 - 32 mmol/L 26 20(L) 8(L)  Calcium 8.9 - 10.3 mg/dL 8.0(L) 7.9(L) 9.0    Intake/Output Summary (Last 24 hours) at 11/27/2017 0909 Last data filed at 11/27/2017 0600 Gross per 24 hour  Intake 8805.46 ml  Output 1325 ml  Net 7480.46 ml    Current Facility-Administered Medications:  .  0.9 %  sodium chloride infusion, , Intravenous, Continuous, Rigoberto Noel, MD, Last Rate: 50 mL/hr at 11/27/17 0600 .  acetaminophen (TYLENOL) solution 650 mg, 650 mg, Oral, Q6H PRN, Rigoberto Noel, MD, 650 mg at 11/26/17 0049 .  ceFEPIme (MAXIPIME) 2 g in sodium chloride 0.9 % 100 mL IVPB, 2 g, Intravenous, Q8H, Susa Raring, RPH, Last Rate: 200 mL/hr at 11/27/17 0600 .  chlorhexidine gluconate (MEDLINE KIT) (PERIDEX) 0.12 % solution 15 mL, 15 mL, Mouth Rinse, BID, Kerney Elbe, MD, 15 mL at 11/27/17 0801 .  feeding supplement (PRO-STAT SUGAR FREE 64) liquid 30 mL, 30 mL, Per Tube, TID, Rush Farmer,  MD, 30 mL at 11/26/17 2125 .  feeding supplement (VITAL HIGH PROTEIN) liquid 1,000 mL, 1,000 mL, Per Tube, Continuous, Rush Farmer, MD, Last Rate: 60 mL/hr at 11/27/17 0630, 1,000 mL at 11/27/17 0630 .  fentaNYL (SUBLIMAZE) bolus via infusion 50 mcg, 50 mcg, Intravenous, Q1H PRN, Maryan Rued, Whitney, MD .  fentaNYL (SUBLIMAZE) injection 100 mcg, 100 mcg, Intravenous, Q15 min PRN, Mannam, Praveen, MD .  fentaNYL (SUBLIMAZE) injection 100 mcg, 100 mcg, Intravenous, Q2H PRN, Mannam, Praveen, MD .  fentaNYL 252mg in NS 2518m(1039mml) infusion-PREMIX, 10 mcg/hr, Intravenous, Continuous, Plunkett, Whitney, MD, Last Rate: 5 mL/hr at 11/24/17 2315, 50 mcg/hr at 11/24/17 2315 .  fentaNYL 2500m28mn NS 250mL58mmcg80m infusion-PREMIX, 25-400 mcg/hr, Intravenous, Continuous, Plunkett, Whitney, MD, Last Rate: 20 mL/hr at 11/27/17 0855, 200 mcg/hr at 11/27/17 0855 .  iopamidol (ISOVUE-370) 76 % injection 100 mL, 100 mL, Intravenous, Once PRN, PlunkeMaryan Ruedney, MD .  lacosamide (VIMPAT) 100 mg in sodium chloride 0.9 % 25 mL IVPB, 100 mg, Intravenous, Q12H, KirkpaGreta DoomStopped at 11/26/17 2313 .  levETIRAcetam (KEPPRA) IVPB 1500 mg/ 100 mL premix, 1,500 mg, Intravenous, Q12H, KirkpaGreta DoomLast Rate: 400 mL/hr at 11/27/17 0859, 1,500 mg at 11/27/17 0859 .  MEDLINE mouth rinse, 15 mL, Mouth Rinse, 10 times per day, Kerney Elbe, MD, 15 mL at 11/27/17 0539 .  metroNIDAZOLE (FLAGYL) IVPB 500 mg, 500 mg, Intravenous, Q8H, Rigoberto Noel, MD, Last Rate: 100 mL/hr at 11/27/17 0636, 500 mg at 11/27/17 0636 .  midazolam (VERSED) 100 mg in sodium chloride 0.9 % 100 mL (1 mg/mL) infusion, 10 mg/hr, Intravenous, Continuous, Greta Doom, MD, Last Rate: 10 mL/hr at 11/27/17 0600, 10 mg/hr at 11/27/17 0600 .  pantoprazole sodium (PROTONIX) 40 mg/20 mL oral suspension 40 mg, 40 mg, Per Tube, Q1200, Rigoberto Noel, MD, 40 mg at 11/26/17 1133 .  valproate (DEPACON) 800 mg in dextrose  5 % 50 mL IVPB, 800 mg, Intravenous, Q8H, Greta Doom, MD, Last Rate: 58 mL/hr at 11/27/17 0600 .  vancomycin (VANCOCIN) 1,250 mg in sodium chloride 0.9 % 250 mL IVPB, 1,250 mg, Intravenous, Q8H, Masters, Jake Church, RPH .  vancomycin (VANCOCIN) IVPB 1000 mg/200 mL premix, 1,000 mg, Intravenous, Once, Masters, Jake Church, [facility], Last Rate: 200 mL/hr at 11/27/17 0853, 1,000 mg at 11/27/17 6967   Physical Exam: Intubated, sedated, closes eyes when forcefully opened, pupils pinpoint (on high dose fentanyl), gaze neutral, +c/c/g, no withdrawal to pain, +bifrontal swelling, incision c/d/i  Assessment & Plan: 42 y.o. man s/p emergent evac of subdural empyema, brain abscess, reconstruction of skull base. -bifrontal breach rhythm is likely consistent with craniotomy defect. Bone grafts are harvested from the posterior flap to reconstruct the skull base. Additionally, bone flap is affixed as frontal as possible to improve cosmesis, which leaves a larger posterior defect -status management per neurology / CCM, appreciate care. If pt is unable to be weaned off high dose sedation by POD3 (10/23), should repeat a CTH since we don't have an exam to follow.  Joyice Faster Nita Whitmire  11/27/17 9:09 AM

## 2017-11-27 NOTE — Progress Notes (Signed)
Midazolam 100 mg/100 mL IV returned to the pharmacy unused. Wasted in the main pharmacy's Stericycle container. Bayard Hugger, PharmD, was witness.

## 2017-11-27 NOTE — Progress Notes (Signed)
EEG maint complete. Re-glued leads/ no skin breakdown

## 2017-11-27 NOTE — Procedures (Signed)
  Signed        Electroencephalography report.  Long-term monitoring  Recording begins 11/26/2017 at 8:23 AM Recording ends to 11/27/2017 at 7:30 AM  CPT 95951 Day 2  Date acquisition: [employer] 10-20 for electrodes placement.  18 channels EEG with additional EKG channel  day 1  This EEG was requested this patient to was found unresponsive with convulsions.  Patient is intubated.  Sedation status is not available  Background activities marked by background activity slowing in the delta range predominantly with a superimposed faster frequencies carrying sharply contoured morphology distributed paracentrally.  Occasionally paracentral frontal paracentral left and right sharp waves were present.  This possibly could represent breach rhythm in appropriate clinical setting.  However detailed history is not available There is a one pushbutton activation event around 1127.  Reason for activation of event button is unclear.  There is no description provided.  There was no obvious clinical manifestations to this pushbutton activation events available reviewing the video monitoring.  EEG was accompanied by arousal   Day 2 : No clinical or subclinical seizures present throughout the recording.  Background activity slowing noted predominantly in the mid and delta range.  Persistent electrode artifact at T5 and T 4 present throughout the recording.  Impedances at 10k Ohms Right frontocentral spikes present however given significant electrode artifact throughout the recording these are difficult to interpret.  Faster frequencies again noted in pericentral regions.   Clinical interpretation: This day 2 of intensive EEG monitoring with simultaneous video monitoring did not record any clinical or subclinical seizures.  Background activities marked by predominantly delta slowing with superimposed faster frequencies with  sharply contoured morphology distributed more anteriorly and paracentrally.   Admixed sharp waves in paracentral left and right region also present.  Could represent breach rhythm in appropriate clinical setting however detailed history is not available.  Possibly right frontocentral spikes noted may be suggestive of cortical irritability in that region.  Persistent electrode artifact as discussed above. Pushbutton activation event was coinciding with arousal on EEG.  Clinical correlation is advised.

## 2017-11-27 NOTE — Progress Notes (Signed)
[facility]  Date of Admission:  11/24/2017     Total days of antibiotics 4         ASSESSMENT/PLAN  Mr. Ryan Bryan is on Day 4 of antimicrobial therapy and POD 2 from bilateral craniotomy for infection evacuation of subdural empyema resulting in status epilepticus. Remains on sedation. Surgical cultures remain without growth and respiratory culture consistent with normal flora. Kidney function stable without evidence of nephrotoxicity and most recent vancomycin trough is subtherapeutic. Exam is limited at this time secondary to sedation which is being weaned slowly without evidence of seizures.   1. Continue broad spectrum coverage with vancomycin, cefepime and metronidazole.  2. Monitor cultures and fever curve.    Active Problems:   Status epilepticus ([place])   . chlorhexidine gluconate (MEDLINE KIT)  15 mL Mouth Rinse BID  . feeding supplement (PRO-STAT SUGAR FREE 64)  30 mL Per Tube TID  . mouth rinse  15 mL Mouth Rinse 10 times per day  . pantoprazole sodium  40 mg Per Tube Q1200    SUBJECTIVE:  EEG without clinical or subclinical seizures with possibly spike suggesting cortical irritability. Elevated temperature this morning of 100. WBC count stable. Sedation being weaned. Family at bedside indicate patient attempted to sit up a little while prior to my arrival but has been lying still since.   No Known Allergies   Review of Systems: Review of Systems  Unable to perform ROS: Intubated      OBJECTIVE: Vitals:   11/27/17 0900 11/27/17 1000 11/27/17 1100 11/27/17 1129  BP: 135/76 128/68 (!) 141/70 (!) 141/70  Pulse: 90 92 96 93  Resp: 20 20 (!) 22 (!) 21  Temp:      TempSrc:      SpO2: 97% 97% 96% 97%  Weight:      Height:       Body mass index is 34.85 kg/m.  Physical Exam  Constitutional: He appears well-developed and well-nourished. He has a sickly appearance. He appears ill. No distress. He is intubated.  HENT:  Surgical site  with staples present and is clean and dry without evidence of infection.   Cardiovascular: Normal rate, regular rhythm, normal heart sounds and intact distal pulses. Exam reveals no gallop and no friction rub.  No murmur heard. Pulmonary/Chest: Effort normal and breath sounds normal. No stridor. He is intubated. No respiratory distress. He has no wheezes. He has no rales.  Skin: Skin is warm and dry.    Lab Results Lab Results  Component Value Date   WBC 12.0 (H) 11/26/2017   HGB 11.0 (L) 11/26/2017   HCT 34.5 (L) 11/26/2017   MCV 89.6 11/26/2017   PLT 298 11/26/2017    Lab Results  Component Value Date   CREATININE 0.58 (L) 11/27/2017   BUN 5 (L) 11/27/2017   NA 142 11/27/2017   K 3.3 (L) 11/27/2017   CL 107 11/27/2017   CO2 30 11/27/2017    Lab Results  Component Value Date   ALT 24 11/24/2017   AST 40 11/24/2017   ALKPHOS 84 11/24/2017   BILITOT 0.7 11/24/2017     Microbiology: Recent Results (from the past 240 hour(s))  Culture, blood (routine x 2)     Status: None (Preliminary result)   Collection Time: 11/24/17  7:15 PM  Result Value Ref Range Status   Specimen Description BLOOD BLOOD RIGHT HAND  Final   Special Requests   Final    Blood Culture adequate  volume BOTTLES DRAWN AEROBIC ONLY   Culture   Final    NO GROWTH 3 DAYS Performed at [facility], [place] [address], [place], [place] [address]    Report Status PENDING  Incomplete  Culture, blood (routine x 2)     Status: None (Preliminary result)   Collection Time: 11/24/17  7:15 PM  Result Value Ref Range Status   Specimen Description BLOOD BLOOD RIGHT HAND  Final   Special Requests   Final    BOTTLES DRAWN AEROBIC ONLY Blood Culture adequate volume   Culture   Final    NO GROWTH 3 DAYS Performed at [facility], [place] [address] [place], [place] [address]    Report Status PENDING  Incomplete  MRSA PCR Screening     Status: None   Collection Time: 11/24/17  7:51 PM  Result Value Ref Range  Status   MRSA by PCR NEGATIVE NEGATIVE Final    Comment:        The GeneXpert MRSA Assay (FDA approved for NASAL specimens only), is one component of a comprehensive MRSA colonization surveillance program. It is not intended to diagnose MRSA infection nor to guide or monitor treatment for MRSA infections. Performed at [facility], [place] [address] [place], [place] [address]   Aerobic/Anaerobic Culture (surgical/deep wound)     Status: None (Preliminary result)   Collection Time: 11/25/17  2:02 AM  Result Value Ref Range Status   Specimen Description ABSCESS  Final   Special Requests BRAIN ID A  Final   Gram Stain NO WBC SEEN NO ORGANISMS SEEN   Final   Culture   Final    NO GROWTH 2 DAYS NO ANAEROBES ISOLATED; CULTURE IN PROGRESS FOR 5 DAYS Performed at [facility], [address], [place], [place] [address]    Report Status PENDING  Incomplete  Aerobic/Anaerobic Culture (surgical/deep wound)     Status: None (Preliminary result)   Collection Time: 11/25/17  2:46 AM  Result Value Ref Range Status   Specimen Description ABSCESS  Final   Special Requests BRAIN  ID B  Final   Gram Stain   Final    RARE WBC PRESENT, PREDOMINANTLY PMN NO ORGANISMS SEEN    Culture   Final    NO GROWTH 2 DAYS NO ANAEROBES ISOLATED; CULTURE IN PROGRESS FOR 5 DAYS Performed at [facility], [place] [address] [place], [place] [address]    Report Status PENDING  Incomplete  Urine culture     Status: None   Collection Time: 11/25/17  6:01 AM  Result Value Ref Range Status   Specimen Description URINE, CATHETERIZED  Final   Special Requests NONE  Final   Culture   Final    NO GROWTH Performed at [facility], [place] [address] [place], [place] [address]    Report Status 11/26/2017 FINAL  Final  Culture, respiratory (tracheal aspirate)     Status: None   Collection Time: 11/25/17  8:27 AM  Result Value Ref Range Status   Specimen Description TRACHEAL ASPIRATE  Final    Special Requests NONE  Final   Gram Stain   Final    RARE WBC PRESENT,BOTH PMN AND MONONUCLEAR FEW GRAM POSITIVE COCCI IN CLUSTERS    Culture   Final    FEW Consistent with normal respiratory flora. Performed at [facility], [place] [address], [place],  [address]    Report Status 11/27/2017 FINAL  Final     Terri Piedra, [facility] [phone] Pager  11/27/2017  12:32 PM

## 2017-11-27 NOTE — Progress Notes (Signed)
Pharmacy Antibiotic Note  Ryan Bryan is a 42 y.o. male admitted on 11/24/2017 with brain abscess. CT head finding 3 separate subdural empyemas with marked adjacent vasogenic edema with improved extraconal abscess. WBC trending down, afebrile. Scr 0.75. Vancomycin trough drawn this morning returned subtherapeutic at 4. All cultures remain negative to date.   Plan: -Give vancomycin 2 g load (patient already rec'd 1 g, will give additional 1 g) -Continue maintenance vancomycin with 1250 mg IV q8h -Cefepime 2 g IV q89h -Metronidazole 500 mg IV q8h -Monitor renal fx, cultures, check vancomycin trough again at steady state   Height: 6\' 1"  (185.4 cm) Weight: 264 lb 1.8 oz (119.8 kg) IBW/kg (Calculated) : 79.9  Temp (24hrs), Avg:99.1 F (37.3 C), Min:98.4 F (36.9 C), Max:99.4 F (37.4 C)  Recent Labs  Lab 11/24/17 1440 11/24/17 1915 11/24/17 2232 11/25/17 0424 11/26/17 0405 11/27/17 0539  WBC 16.9*  --   --  15.5* 12.0*  --   CREATININE 1.28*  --   --  0.83 0.75  --   LATICACIDVEN  --  2.9* 2.8*  --   --   --   VANCOTROUGH  --   --   --   --   --  <4*     Antimicrobials this admission: Vancomycin 10/19 >>  Metronidazole 10/19 >>  Ceftriaxone 10/19>>  Dose adjustments this admission: 10/22 vancomycin trough: 4  Microbiology results: Bcx 10/19: ngtd Ucx 10/19: neg Sputum 10/19: reincubated 10/19 mrsa pcr: neg   Ryan Bryan, Ryan Bryan 11/27/2017 8:03 AM

## 2017-11-28 ENCOUNTER — Inpatient Hospital Stay (HOSPITAL_COMMUNITY): Payer: Self-pay

## 2017-11-28 DIAGNOSIS — Z95828 Presence of other vascular implants and grafts: Secondary | ICD-10-CM

## 2017-11-28 DIAGNOSIS — I10 Essential (primary) hypertension: Secondary | ICD-10-CM

## 2017-11-28 LAB — GLUCOSE, CAPILLARY
GLUCOSE-CAPILLARY: 95 mg/dL (ref 70–99)
GLUCOSE-CAPILLARY: 96 mg/dL (ref 70–99)
Glucose-Capillary: 106 mg/dL — ABNORMAL HIGH (ref 70–99)
Glucose-Capillary: 85 mg/dL (ref 70–99)
Glucose-Capillary: 94 mg/dL (ref 70–99)
Glucose-Capillary: 85 mg/dL (ref 70–99)

## 2017-11-28 LAB — COMPREHENSIVE METABOLIC PANEL
ALT: 12 U/L (ref 0–44)
AST: 15 U/L (ref 15–41)
Albumin: 2.2 g/dL — ABNORMAL LOW (ref 3.5–5.0)
Alkaline Phosphatase: 46 U/L (ref 38–126)
Anion gap: 4 — ABNORMAL LOW (ref 5–15)
BILIRUBIN TOTAL: 0.4 mg/dL (ref 0.3–1.2)
BUN: 7 mg/dL (ref 6–20)
CO2: 32 mmol/L (ref 22–32)
Calcium: 7.8 mg/dL — ABNORMAL LOW (ref 8.9–10.3)
Chloride: 107 mmol/L (ref 98–111)
Creatinine, Ser: 0.54 mg/dL — ABNORMAL LOW (ref 0.61–1.24)
GFR calc non Af Amer: >60 mL/min (ref >60)
Glucose, Bld: 98 mg/dL (ref 70–99)
POTASSIUM: 3.2 mmol/L — AB (ref 3.5–5.1)
Sodium: 143 mmol/L (ref 135–145)
TOTAL PROTEIN: 5.1 g/dL — AB (ref 6.5–8.1)

## 2017-11-28 LAB — POCT I-STAT 3, ART BLOOD GAS (G3+)
ACID-BASE EXCESS: 8 mmol/L — AB (ref 0.0–2.0)
BICARBONATE: 33.3 mmol/L — AB (ref 20.0–28.0)
O2 Saturation: 98 %
PH ART: 7.396 (ref 7.350–7.450)
TCO2: 35 mmol/L — ABNORMAL HIGH (ref 22–32)
pCO2 arterial: 54.3 mmHg — ABNORMAL HIGH (ref 32.0–48.0)
pO2, Arterial: 106 mmHg (ref 83.0–108.0)

## 2017-11-28 LAB — CBC
HCT: 27.1 % — ABNORMAL LOW (ref 39.0–52.0)
HCT: 27.4 % — ABNORMAL LOW (ref 39.0–52.0)
HEMOGLOBIN: 7.9 g/dL — AB (ref 13.0–17.0)
HEMOGLOBIN: 8.5 g/dL — AB (ref 13.0–17.0)
MCH: 27.1 pg (ref 26.0–34.0)
MCH: 28.5 pg (ref 26.0–34.0)
MCHC: 29.2 g/dL — ABNORMAL LOW (ref 30.0–36.0)
MCHC: 31 g/dL (ref 30.0–36.0)
MCV: 93.1 fL (ref 80.0–100.0)
MCV: 91.9 fL (ref 80.0–100.0)
NRBC: 0 % (ref 0.0–0.2)
Platelets: 203 10*3/uL (ref 150–400)
Platelets: 214 10*3/uL (ref 150–400)
RBC: 2.91 MIL/uL — ABNORMAL LOW (ref 4.22–5.81)
RBC: 2.98 MIL/uL — AB (ref 4.22–5.81)
RDW: 14.5 % (ref 11.5–15.5)
RDW: 14.5 % (ref 11.5–15.5)
WBC: 6 10*3/uL (ref 4.0–10.5)
WBC: 7.9 10*3/uL (ref 4.0–10.5)
nRBC: 0 % (ref 0.0–0.2)

## 2017-11-28 LAB — MAGNESIUM: MAGNESIUM: 2.2 mg/dL (ref 1.7–2.4)

## 2017-11-28 LAB — VALPROIC ACID LEVEL: VALPROIC ACID LVL: 65 ug/mL (ref 50.0–100.0)

## 2017-11-28 LAB — PHOSPHORUS: PHOSPHORUS: 2.7 mg/dL (ref 2.5–4.6)

## 2017-11-28 LAB — AMMONIA: Ammonia: 50 umol/L — ABNORMAL HIGH (ref 9–35)

## 2017-11-28 MED ORDER — CHLORHEXIDINE GLUCONATE 0.12 % MT SOLN
15.0000 mL | Freq: Two times a day (BID) | OROMUCOSAL | Status: DC
  Administered 2017-11-28: 15 mL via OROMUCOSAL

## 2017-11-28 MED ORDER — ORAL CARE MOUTH RINSE: 15.0000 mL | Freq: Two times a day (BID) | OROMUCOSAL | Status: DC

## 2017-11-28 MED ORDER — SODIUM CHLORIDE 0.9 % IV SOLN
2.0000 g | Freq: Two times a day (BID) | INTRAVENOUS | Status: DC
  Administered 2017-11-28 – 2017-12-03 (×11): 2 g via INTRAVENOUS
  Filled 2017-11-28 (×13): qty 20

## 2017-11-28 MED ORDER — POTASSIUM CHLORIDE 10 MEQ/100ML IV SOLN
10.0000 meq | INTRAVENOUS | Status: AC
  Administered 2017-11-28 (×6): 10 meq via INTRAVENOUS
  Filled 2017-11-28 (×6): qty 100

## 2017-11-28 NOTE — Progress Notes (Addendum)
[facility] for Infectious Disease  Date of Admission:  11/24/2017     Total days of antibiotics 5         ASSESSMENT/PLAN  Mr. Ryan Bryan is POD 3 from bilateral craniotomy for infection evacuation of subdural empyema resulting in status epilepticus. On Day 5 of broad spectrum antimicrobial therapy with vancomycin, cefepime, and metronidazole. He has been afebrile with no leukocytosis. No recent seizures noted on EEG and now extubated and maintaining airway independently. He is able to appropriately respond to some questions and nursing noted able to follow commands overnight. Cultures remain without growth to date. He will require prolonged therapy secondary to CNS involvement and will need a PICC line.   1. Continue broad spectrum coverage with vancomycin, cefepime and metronidazole.  2. Monitor cultures, fever curve and WBC now that he is extubated. 3. Seizure management per CCM and neurology.   Active Problems:   Status epilepticus (H[facility])   Subdural empyema   . chlorhexidine gluconate (MEDLINE KIT)  15 mL Mouth Rinse BID  . mouth rinse  15 mL Mouth Rinse BID    SUBJECTIVE:  Afebrile overnight without leukocytosis. Sedation reduced and no seizures noted. Extubated today.  Nursing indicates about to follow commands overnight.   No Known Allergies   Review of Systems: Review of Systems  Unable to perform ROS: Acuity of condition      OBJECTIVE: Vitals:   11/28/17 0923 11/28/17 0930 11/28/17 1000 11/28/17 1200  BP:  (!) 147/73 (!) 144/73   Pulse:   94   Resp:  12 14   Temp:    99.3 F (37.4 C)  TempSrc:    Oral  SpO2: 96% 96% 92%   Weight:      Height:       Body mass index is 34.85 kg/m.  Physical Exam  Constitutional: He is oriented to person, place, and time. He appears well-developed and well-nourished. He appears lethargic. He has a sickly appearance. He appears ill. No distress. Nasal cannula in place.  HENT:  Incision site appears approximated,  clean and dry with no evidence of infection   Cardiovascular: Normal rate, regular rhythm, normal heart sounds and intact distal pulses. Exam reveals no gallop and no friction rub.  No murmur heard. Arterial line patent without evidence of infection.   Pulmonary/Chest: Effort normal and breath sounds normal. No respiratory distress. He has no wheezes. He has no rales. He exhibits no tenderness.  Abdominal: Soft. Bowel sounds are normal. He exhibits no distension and no mass. There is no tenderness. There is no rebound.  Neurological: He is oriented to person, place, and time. He appears lethargic.  Skin: Skin is warm and dry.  Psychiatric: He has a normal mood and affect. His behavior is normal. Judgment and thought content normal.    Lab Results Lab Results  Component Value Date   WBC 7.9 11/28/2017   HGB 8.5 (L) 11/28/2017   HCT 27.4 (L) 11/28/2017   MCV 91.9 11/28/2017   PLT 214 11/28/2017    Lab Results  Component Value Date   CREATININE 0.54 (L) 11/28/2017   BUN 7 11/28/2017   NA 143 11/28/2017   K 3.2 (L) 11/28/2017   CL 107 11/28/2017   CO2 32 11/28/2017    Lab Results  Component Value Date   ALT 12 11/28/2017   AST 15 11/28/2017   ALKPHOS 46 11/28/2017   BILITOT 0.4 11/28/2017     Microbiology: Recent Results (from the past 240  hour(s))  Culture, blood (routine x 2)     Status: None (Preliminary result)   Collection Time: 11/24/17  7:15 PM  Result Value Ref Range Status   Specimen Description BLOOD BLOOD RIGHT HAND  Final   Special Requests   Final    Blood Culture adequate volume BOTTLES DRAWN AEROBIC ONLY   Culture   Final    NO GROWTH 4 DAYS Performed at [facility], [place] [address] [place], [place] [address]    Report Status PENDING  Incomplete  Culture, blood (routine x 2)     Status: None (Preliminary result)   Collection Time: 11/24/17  7:15 PM  Result Value Ref Range Status   Specimen Description BLOOD BLOOD RIGHT HAND  Final   Special  Requests   Final    BOTTLES DRAWN AEROBIC ONLY Blood Culture adequate volume   Culture   Final    NO GROWTH 4 DAYS Performed at [facility], [place] [address], [place], [place] [address]    Report Status PENDING  Incomplete  MRSA PCR Screening     Status: None   Collection Time: 11/24/17  7:51 PM  Result Value Ref Range Status   MRSA by PCR NEGATIVE NEGATIVE Final    Comment:        The GeneXpert MRSA Assay (FDA approved for NASAL specimens only), is one component of a comprehensive MRSA colonization surveillance program. It is not intended to diagnose MRSA infection nor to guide or monitor treatment for MRSA infections. Performed at [facility], [place] [address], [place], [place] [address]   Aerobic/Anaerobic Culture (surgical/deep wound)     Status: None (Preliminary result)   Collection Time: 11/25/17  2:02 AM  Result Value Ref Range Status   Specimen Description ABSCESS  Final   Special Requests BRAIN ID A  Final   Gram Stain NO WBC SEEN NO ORGANISMS SEEN   Final   Culture   Final    NO GROWTH 3 DAYS NO ANAEROBES ISOLATED; CULTURE IN PROGRESS FOR 5 DAYS Performed at [facility], [address], [place], [place] [address]    Report Status PENDING  Incomplete  Aerobic/Anaerobic Culture (surgical/deep wound)     Status: None (Preliminary result)   Collection Time: 11/25/17  2:46 AM  Result Value Ref Range Status   Specimen Description ABSCESS  Final   Special Requests BRAIN  ID B  Final   Gram Stain   Final    RARE WBC PRESENT, PREDOMINANTLY PMN NO ORGANISMS SEEN    Culture   Final    NO GROWTH 3 DAYS NO ANAEROBES ISOLATED; CULTURE IN PROGRESS FOR 5 DAYS Performed at [facility], [place] [address] [place], [place] [address]    Report Status PENDING  Incomplete  Urine culture     Status: None   Collection Time: 11/25/17  6:01 AM  Result Value Ref Range Status   Specimen Description URINE, CATHETERIZED  Final   Special Requests NONE  Final    Culture   Final    NO GROWTH Performed at [facility], [place] [address] [place], [place] [address]    Report Status 11/26/2017 FINAL  Final  Culture, respiratory (tracheal aspirate)     Status: None   Collection Time: 11/25/17  8:27 AM  Result Value Ref Range Status   Specimen Description TRACHEAL ASPIRATE  Final   Special Requests NONE  Final   Gram Stain   Final  RARE WBC PRESENT,BOTH PMN AND MONONUCLEAR FEW GRAM POSITIVE COCCI IN CLUSTERS    Culture   Final    FEW Consistent with normal respiratory flora. Performed at [facility], [place] [address] [place], [place] [address]    Report Status 11/27/2017 FINAL  Final     Terri Piedra, NP [facility] [phone] Pager  11/28/2017  1:08 PM

## 2017-11-28 NOTE — Progress Notes (Addendum)
NAME:  JORDAN PARDINI, MRN:  [id number], DOB:  Feb 24, 1975, LOS: 4 ADMISSION DATE:  11/24/2017, CONSULTATION DATE:  11/24/17 REFERRING MD: Salley Scarlet, EDP, CHIEF COMPLAINT: Status epilepticus  Brief History   42 year old with history of prior cocaine use, cardiac stent, recent eye surgery, sinus surgery for subperiorbital cellulitis at [facility] admitted with status epilepticus secondary to subdural empyema                              Past Medical History  Drug use, coronary artery disease s/p stent, periorbital cellulitis  Significant Hospital Events   10/2- 10/5- Admit to [facility] for left subperiorbital abscess. S/p FESS and orbitotomy by ENT and opthal. Notes in care everywhere 10/19- Admit to [facility] with status epilepticus, intubated in ED 10/21: Still with seizure overnight, Versed bolus and drip reinitiated, continuing full ventilator support until seizure activity subsides, still awaiting culture data 10/22: Starting to wean Versed, no obvious seizure activity currently 10/23: Following commands.  Pressure support volumes excellent, antibiotics continued.  Sedating medications discontinued.  Plan to extubate once mental status allows and cuff leak present  Consults: date of consult/date signed off & final recs:  Neurology 10/19- Neurosurgery 10/19- ID 10/19-  Procedures (surgical and bedside):  ETT 10/19 > 10/20 Bilateral Craniotomy for Infection Evacuation and Skull Base Defect Repair (Bilateral)  Significant Diagnostic Tests:  CT head 10/19- subdural empyema is in the anterior cranial fossa, improved extra conal abscesses in left superior orbit, interval endoscopic sinus surgery, sinusitis. CTA chest, abd, pelvis 10/19-basilar atelectasis, coronary artery disease in the left anterior descending artery.  I have reviewed the images personally. Abscess Cx ([facility]) 11/09/17- Streptococcus constellatus (viridans Streptococcus) (A) UDS + for BDZ, THC  Micro Data:  Blood cultures 10/19  >> Sputum culture 10/19 Antimicrobials:  Vanco 10/19 >> Flagyl 10/19 >> Cefepime 10/19 >>  Subjective:  Denies discomfort appears comfortable on pressure support  Objective   Blood pressure 123/65, pulse 77, temperature 99.4 F (37.4 C), temperature source Axillary, resp. rate (Abnormal) 21, height 6\' 1"  (1.854 m), weight 119.8 kg, SpO2 98 %.    Vent Mode: PSV;CPAP FiO2 (%):  [30 %-40 %] 30 % Set Rate:  [20 bmp] 20 bmp Vt Set:  [470 mL] 470 mL PEEP:  [5 cmH20] 5 cmH20 Pressure Support:  [5 cmH20] 5 cmH20 Plateau Pressure:  [11 cmH20-21 cmH20] 11 cmH20   Intake/Output Summary (Last 24 hours) at 11/28/2017 0910 Last data filed at 11/28/2017 0800 Gross per 24 hour  Intake 5516.44 ml  Output 1800 ml  Net 3716.44 ml   Filed Weights   11/25/17 0500 11/26/17 0500 11/27/17 0353  Weight: 124.7 kg 125 kg 119.8 kg    Examination: General: 42 year old white male currently resting in bed he is in no acute distress on pressure support ventilation HEENT craniotomy staples intact swelling decreasing able to open eyes orally intubated no JVD Pulmonary: Scattered rhonchi that clear with suction no accessory use tidal volume in the 700-800 range on pressure support of 5 cmH2O Cardiac: Regular rate and rhythm Abdomen: Soft nontender positive bowel sounds no organomegaly Extremities: Trace dependent edema brisk capillary refill warm to palpation Neuro: Opens eyes, follows commands, moves all extremities GU: Clear yellow  Resolved Hospital Problem list   Lactic acidosis Anion gap metabolic acidosis  Assessment & Plan:   Status epilepticus: Resolved  plan Anticonvulsants per neurology EEG per neurosurgery/neurology  Subdural empyema after recent eye, sinus surgery s/p  birfrontal craniotomy and drainage Strep constellatus infection at [facility] Vancomycin, Flagyl, and cefepime as directed by infectious disease  Continuing postoperative care per neurosurgery  Serial neuro checks     Acute respiratory failure secondary to seizures and ineffective airway protection Portable chest x-ray personally reviewed 10/21:Endotracheal tubes in satisfactory position, there is volume loss at the left base, and right basilar atelectasis -Excellent tidal volume on spontaneous breathing trial Plan Continuing pressure support/SBT Check cuff leak now that he is following commands.  If present we will extubate Keep n.p.o. for now Wean oxygen Incentive spirometry when able Follow-up pending sputum culture  Coronary artery disease status post stent Plan Continue telemetry  Holding all medications pending extubation, may need to change some to IV Aspirin PR until can pass swallowing Given recent sinus surgery not sure that nasogastric tube placements and option  Anemia of critical illness No evidence of bleeding, however has had significant hemoglobin drop from 11 down to 7.9.  He is 12 L positive so certainly some of this could be dilutional also wonder simply about lab error Plan Repeat CBC Transfuse for hemoglobin less than 7  At risk for fluid and electrolyte imbalance: Hypokalemia Plan Replace potassium A.m. chemistry  Disposition / Summary of Today's Plan 11/28/17   Off Versed.  Excellent tidal volumes on spontaneous breathing trial, to prevent discontinued.  Will evaluate for extubation once mental status will support this     Code Status: Full Family Communication: Daughter updated 10/19  Labs   CBC: Recent Labs  Lab 11/24/17 1440 11/25/17 0424 11/26/17 0405 11/28/17 0430  WBC 16.9* 15.5* 12.0* 6.0  NEUTROABS 13.0*  --   --   --   HGB 15.1 12.3* 11.0* 7.9*  HCT 48.3 38.1* 34.5* 27.1*  MCV 92.4 87.4 89.6 93.1  PLT 565* 434* 298 203    Basic Metabolic Panel: Recent Labs  Lab 11/24/17 1440 11/25/17 0424 11/26/17 0405 11/27/17 1049 11/28/17 0430  NA 136 139 141 142 143  K 5.0 4.9 4.0 3.3* 3.2*  CL 104 109 107 107 107  CO2 8* 20* 26 30 32   GLUCOSE 309* 115* 114* 112* 98  BUN 8 7 5* 5* 7  CREATININE 1.28* 0.83 0.75 0.58* 0.54*  CALCIUM 9.0 7.9* 8.0* 7.9* 7.8*  MG  --  2.2 2.3  --  2.2  PHOS  --  4.4 2.3*  --  2.7   GFR: Estimated Creatinine Clearance: 163.2 mL/min (A) (by C-G formula based on SCr of 0.54 mg/dL (L)). Recent Labs  Lab 11/24/17 1440 11/24/17 1915 11/24/17 2232 11/25/17 0424 11/26/17 0405 11/28/17 0430  WBC 16.9*  --   --  15.5* 12.0* 6.0  LATICACIDVEN  --  2.9* 2.8*  --   --   --     Liver Function Tests: Recent Labs  Lab 11/24/17 1440 11/28/17 0430  AST 40 15  ALT 24 12  ALKPHOS 84 46  BILITOT 0.7 0.4  PROT 7.7 5.1*  ALBUMIN 3.8 2.2*   No results for input(s): LIPASE, AMYLASE in the last 168 hours. Recent Labs  Lab 11/24/17 1440  AMMONIA 152*    ABG    Component Value Date/Time   PHART 7.396 11/28/2017 0331   PCO2ART 54.3 (H) 11/28/2017 0331   PO2ART 106.0 11/28/2017 0331   HCO3 33.3 (H) 11/28/2017 0331   TCO2 35 (H) 11/28/2017 0331   ACIDBASEDEF 2.8 (H) 11/25/2017 0550   O2SAT 98.0 11/28/2017 0331     Coagulation Profile: No results  for input(s): INR, PROTIME in the last 168 hours.  Cardiac Enzymes: Recent Labs  Lab 11/24/17 1915 11/25/17 0424  TROPONINI 0.06* 0.06*    HbA1C: No results found for: HGBA1C  CBG: Recent Labs  Lab 11/27/17 1525 11/27/17 1927 11/27/17 2316 11/28/17 0320 11/28/17 0745  GLUCAP 98 99 99 106* 95   Simonne Martinet ACNP-BC [facility] Pulmonary/Critical Care Pager # [phone] OR # [phone] if no answer  11/28/2017, 9:10 AM  Attending Note:  42 year old male with brain abscess who presents to PCCM with respiratory failure and septic shock with seizure and burst suppression.  Burst suppression has been complete at this point.  On exam, patient is awake and interactive, moving all ext to command but sluggish and confused.  I reviewed CXR myself, ETT is in a good position.  Discussed with PCCM-NP.  Will proceed with weaning and extubate  today.  SLP.  OOB to chair as able.  PT evaluation and treatment.  D/C pressors.  D/C sedation.  Pain control.  Continue abx, appreciate input from ID service.  PCCM will continue to follow.  The patient is critically ill with multiple organ systems failure and requires high complexity decision making for assessment and support, frequent evaluation and titration of therapies, application of advanced monitoring technologies and extensive interpretation of multiple databases.   Critical Care Time devoted to patient care services described in this note is  33  Minutes. This time reflects time of care of this signee Dr Koren Bound. This critical care time does not reflect procedure time, or teaching time or supervisory time of PA/NP/Med student/Med Resident etc but could involve care discussion time.  Alyson Reedy, M.D. [facility]monary[facility]. Pager: [phone]. After hours pager: [phone].

## 2017-11-28 NOTE — Progress Notes (Signed)
Neurosurgery Service Progress Note  Subjective: No acute events overnight, per nursing and critical care team, pt was following commands overnight  Objective: Vitals:   11/28/17 0700 11/28/17 0800 11/28/17 0820 11/28/17 0904  BP: 128/67 123/65 123/65   Pulse: 76 76 77   Resp: (!) 21 (!) 22 (!) 21   Temp:  99.4 F (37.4 C)    TempSrc:  Axillary    SpO2: 97% 98% 97% 98%  Weight:      Height:       Temp (24hrs), Avg:99.3 F (37.4 C), Min:98.6 F (37 C), Max:100 F (37.8 C)  CBC Latest Ref Rng & Units 11/28/2017 11/26/2017 11/25/2017  WBC 4.0 - 10.5 K/uL 6.0 12.0(H) 15.5(H)  Hemoglobin 13.0 - 17.0 g/dL 7.9(L) 11.0(L) 12.3(L)  Hematocrit 39.0 - 52.0 % 27.1(L) 34.5(L) 38.1(L)  Platelets 150 - 400 K/uL 203 298 434(H)   BMP Latest Ref Rng & Units 11/28/2017 11/27/2017 11/26/2017  Glucose 70 - 99 mg/dL 98 112(H) 114(H)  BUN 6 - 20 mg/dL 7 5(L) 5(L)  Creatinine 0.61 - 1.24 mg/dL 0.54(L) 0.58(L) 0.75  Sodium 135 - 145 mmol/L 143 142 141  Potassium 3.5 - 5.1 mmol/L 3.2(L) 3.3(L) 4.0  Chloride 98 - 111 mmol/L 107 107 107  CO2 22 - 32 mmol/L 32 30 26  Calcium 8.9 - 10.3 mg/dL 7.8(L) 7.9(L) 8.0(L)    Intake/Output Summary (Last 24 hours) at 11/28/2017 0910 Last data filed at 11/28/2017 0800 Gross per 24 hour  Intake 5516.44 ml  Output 1800 ml  Net 3716.44 ml    Current Facility-Administered Medications:  .  0.9 %  sodium chloride infusion, , Intravenous, Continuous, Rigoberto Noel, MD, Last Rate: 50 mL/hr at 11/28/17 0800 .  0.9 %  sodium chloride infusion, , Intravenous, PRN, Wilhelmina Mcardle, MD, Last Rate: 10 mL/hr at 11/28/17 0800 .  acetaminophen (TYLENOL) solution 650 mg, 650 mg, Oral, Q6H PRN, Rigoberto Noel, MD, 650 mg at 11/27/17 1017 .  ceFEPIme (MAXIPIME) 2 g in sodium chloride 0.9 % 100 mL IVPB, 2 g, Intravenous, Q8H, Susa Raring, [place], Stopped at 11/28/17 0175 .  chlorhexidine gluconate (MEDLINE KIT) (PERIDEX) 0.12 % solution 15 mL, 15 mL, Mouth Rinse, BID,  Kerney Elbe, MD, 15 mL at 11/28/17 0805 .  feeding supplement (PRO-STAT SUGAR FREE 64) liquid 30 mL, 30 mL, Per Tube, TID, Rush Farmer, MD, 30 mL at 11/27/17 2123 .  feeding supplement (VITAL HIGH PROTEIN) liquid 1,000 mL, 1,000 mL, Per Tube, Continuous, Rush Farmer, MD, Last Rate: 60 mL/hr at 11/28/17 0700 .  fentaNYL (SUBLIMAZE) bolus via infusion 50 mcg, 50 mcg, Intravenous, Q1H PRN, Maryan Rued, Whitney, MD .  fentaNYL (SUBLIMAZE) injection 100 mcg, 100 mcg, Intravenous, Q15 min PRN, Mannam, Praveen, MD .  fentaNYL (SUBLIMAZE) injection 100 mcg, 100 mcg, Intravenous, Q2H PRN, Mannam, Praveen, MD .  fentaNYL 2526mg in NS 2541m(1054mml) infusion-PREMIX, 10 mcg/hr, Intravenous, Continuous, Plunkett, Whitney, MD, Last Rate: 5 mL/hr at 11/24/17 2315, 50 mcg/hr at 11/24/17 2315 .  fentaNYL 2500m65mn NS 250mL36mmcg38m infusion-PREMIX, 25-400 mcg/hr, Intravenous, Continuous, Plunkett, Whitney, MD, Last Rate: 5 mL/hr at 11/28/17 0905, 50 mcg/hr at 11/28/17 0905 .  iopamidol (ISOVUE-370) 76 % injection 100 mL, 100 mL, Intravenous, Once PRN, PlunkeMaryan Ruedney, MD .  lacosamide (VIMPAT) 100 mg in sodium chloride 0.9 % 25 mL IVPB, 100 mg, Intravenous, Q12H, KirkpaGreta DoomStopped at 11/27/17 2307 .  levETIRAcetam (KEPPRA) IVPB 1500 mg/ 100 mL premix, 1,500 mg,  Intravenous, Q12H, Greta Doom, MD, Last Rate: 400 mL/hr at 11/28/17 0809, 1,500 mg at 11/28/17 0809 .  MEDLINE mouth rinse, 15 mL, Mouth Rinse, 10 times per day, Kerney Elbe, MD, 15 mL at 11/28/17 7408 .  metroNIDAZOLE (FLAGYL) IVPB 500 mg, 500 mg, Intravenous, Q8H, Rigoberto Noel, MD, Stopped at 11/28/17 [phone]  midazolam (VERSED) 100 mg in sodium chloride 0.9 % 100 mL (1 mg/mL) infusion, 10 mg/hr, Intravenous, Continuous, Greta Doom, MD, Stopped at 11/27/17 1238 .  pantoprazole sodium (PROTONIX) 40 mg/20 mL oral suspension 40 mg, 40 mg, Per Tube, Q1200, Rigoberto Noel, MD, 40 mg at 11/27/17 1146 .   polyethylene glycol (MIRALAX / GLYCOLAX) packet 17 g, 17 g, Oral, Daily, Salvadore Dom E, NP, 17 g at 11/27/17 1621 .  propofol (DIPRIVAN) 1000 MG/100ML infusion, 5-80 mcg/kg/min, Intravenous, Titrated, Rush Farmer, MD, Stopped at 11/28/17 [phone] .  sennosides (SENOKOT) 8.8 MG/5ML syrup 15 mL, 15 mL, Per Tube, BID, Salvadore Dom E, NP, 15 mL at 11/27/17 1621 .  valproate (DEPACON) 800 mg in dextrose 5 % 50 mL IVPB, 800 mg, Intravenous, Q8H, Greta Doom, MD, Stopped at 11/28/17 [phone] .  vancomycin (VANCOCIN) 1,250 mg in sodium chloride 0.9 % 250 mL IVPB, 1,250 mg, Intravenous, Q8H, Masters, Jake Church, RPH, Last Rate: 166.7 mL/hr at 11/28/17 0829, 1,250 mg at 11/28/17 7026   Physical Exam: Intubated, sedated, closes eyes when forcefully opened, pupils pinpoint (on fentanyl), gaze neutral, +c/c/g, no withdrawal to pain, +bifrontal swelling - improving incision c/d/i  Assessment & Plan: 42 y.o. man s/p emergent evac of subdural empyema, brain abscess, reconstruction of skull base. -FC overnight, reassuring exam -status management per neurology / CCM, appreciate care  Judith Part  11/28/17 9:10 AM

## 2017-11-28 NOTE — Evaluation (Signed)
Clinical/Bedside Swallow Evaluation Patient Details  Name: Ryan Bryan MRN: [id number] Date of Birth: [birth year]  Today's Date: 11/28/2017 Time: SLP Start Time (ACUTE ONLY): 1540 SLP Stop Time (ACUTE ONLY): 1556 SLP Time Calculation (min) (ACUTE ONLY): 16 min  Past Medical History: History reviewed. No pertinent past medical history. Past Surgical History:  Past Surgical History:  Procedure Laterality Date  . CRANIOTOMY Bilateral 11/24/2017   Procedure: Bilateral Craniotomy for Infection Evacuation and Skull Base Defect Repair;  Surgeon: Jadene Pierini, MD;  Location: [facility] OR;  Service: Neurosurgery;  Laterality: Bilateral;   HPI:  42 year old with history of prior cocaine use, cardiac stent, recent eye surgery, sinus surgery for subperiorbital cellulitis at [facility] admitted with status epilepticus secondary to subdural empyema. 10/5- Admit to [facility] for left subperiorbital abscess. S/p FESS and orbitotomy by ENT and opthal. 10/19- Admit to [facility] with status epilepticus, secondary to cerebritis and bilateral frontal empyema. He is now status post emergent bifrontal craniotomy with evacuation of abscess and subdural empyema with skull base reconstruction.   intubated in ED 10/19. Extubated on 10/23.    Assessment / Plan / Recommendation Clinical Impression  Pt demonstrates signs of a post extubation related dysphagia that will likely resolve over the short term. Vocal quality is mildly hoarse and consecutive sips of water are followed by slight throat clearing. Concerned there may be decreased laryngeal sesation of aspiration. Trials of puree however were tolerated well and would be unlikely to penetrate without pt response.  Pt could take bites of puree with crushed meds as needed with f/u tomorrow for further trials with liquids with increase time after extubation. Could f/u with objective testing if needed.  SLP Visit Diagnosis: Dysphagia, oropharyngeal phase (R13.12)    Aspiration Risk  Moderate aspiration risk    Diet Recommendation NPO except meds   Medication Administration: Whole meds with puree Supervision: Staff to assist with self feeding Compensations: Slow rate;Small sips/bites Postural Changes: Seated upright at 90 degrees    Other  Recommendations Oral Care Recommendations: Oral care QID Other Recommendations: Have oral suction available   Follow up Recommendations Skilled Nursing facility      Frequency and Duration min 2x/week  2 weeks       Prognosis        Swallow Study   General HPI: 42 year old with history of prior cocaine use, cardiac stent, recent eye surgery, sinus surgery for subperiorbital cellulitis at [facility] admitted with status epilepticus secondary to subdural empyema. 10/5- Admit to [facility] for left subperiorbital abscess. S/p FESS and orbitotomy by ENT and opthal. 10/19- Admit to [facility] with status epilepticus, secondary to cerebritis and bilateral frontal empyema. He is now status post emergent bifrontal craniotomy with evacuation of abscess and subdural empyema with skull base reconstruction.   intubated in ED 10/19. Extubated on 10/23.  Type of Study: Bedside Swallow Evaluation Previous Swallow Assessment: none Diet Prior to this Study: NPO Temperature Spikes Noted: No Respiratory Status: Nasal cannula History of Recent Intubation: Yes Length of Intubations (days): 5 days Date extubated: 11/28/17 Behavior/Cognition: Requires cueing Oral Cavity Assessment: Within Functional Limits Oral Care Completed by SLP: No Oral Cavity - Dentition: Adequate natural dentition Vision: Functional for self-feeding Self-Feeding Abilities: Able to feed self Patient Positioning: Upright in bed Baseline Vocal Quality: Low vocal intensity;Hoarse Volitional Cough: Strong;Congested Volitional Swallow: Able to elicit    Oral/Motor/Sensory Function Overall Oral Motor/Sensory Function: Other (comment)(twitching on right lips)   Ice Chips Ice chips: Within  functional limits Presentation: Spoon  Thin Liquid Thin Liquid: Impaired Presentation: Cup;Straw Pharyngeal  Phase Impairments: Throat Clearing - Immediate    Nectar Thick Nectar Thick Liquid: Not tested   Honey Thick Honey Thick Liquid: Not tested   Puree Puree: Within functional limits Presentation: Spoon   Solid     Solid: Not tested     Harlon Ditty, MA CCC-SLP  Acute Rehabilitation Services Pager [phone] Office [phone]  Claudine Mouton 11/28/2017,4:04 PM

## 2017-11-28 NOTE — Procedures (Signed)
Extubation Procedure Note  Patient Details:   Name: Ryan Bryan DOB: 04-25-1975 MRN: [id number]   Airway Documentation:    Vent end date: 11/28/17 Vent end time: 0929   Evaluation  O2 sats: stable throughout Complications: No apparent complications Patient did tolerate procedure well. Bilateral Breath Sounds: Clear, Diminished   Pt extubated per MD order.  Placed on 4L [place] tolerating well, no distress noted at this time  Cherylin Mylar 11/28/2017, 9:31 AM

## 2017-11-28 NOTE — Progress Notes (Signed)
Reason for consult:   Subjective: Patient is awake and following commands.  No seizure activity noted overnight.   ROS: negative except above Examination  Vital signs in last 24 hours: Temp:  [98.6 F (37 C)-99.7 F (37.6 C)] 99.3 F (37.4 C) (10/23 1200) Pulse Rate:  [73-108] 98 (10/23 1200) Resp:  [11-25] 13 (10/23 1200) BP: (118-161)/(61-83) 154/83 (10/23 1200) SpO2:  [92 %-100 %] 97 % (10/23 1200) Arterial Line BP: (112-186)/(46-67) 186/65 (10/23 1200) FiO2 (%):  [30 %-40 %] 36 % (10/23 0923)  General: lying in bed CVS: pulse-normal rate and rhythm RS: breathing comfortably Extremities: normal   Neuro: MS: Alert, follows simple commands such wiggling toes, raising arms CN: pupils equal and reactive, EOMI, face symmetric, tongue midline Motor: Appears to move all 4 extremities equally  Reflexes: 2+ over bilateral patella Gait: unable to test  Basic Metabolic Panel: Recent Labs  Lab 11/24/17 1440 11/25/17 0424 11/26/17 0405 11/27/17 1049 11/28/17 0430  NA 136 139 141 142 143  K 5.0 4.9 4.0 3.3* 3.2*  CL 104 109 107 107 107  CO2 8* 20* 26 30 32  GLUCOSE 309* 115* 114* 112* 98  BUN 8 7 5* 5* 7  CREATININE 1.28* 0.83 0.75 0.58* 0.54*  CALCIUM 9.0 7.9* 8.0* 7.9* 7.8*  MG  --  2.2 2.3  --  2.2  PHOS  --  4.4 2.3*  --  2.7    CBC: Recent Labs  Lab 11/24/17 1440 11/25/17 0424 11/26/17 0405 11/28/17 0430 11/28/17 1048  WBC 16.9* 15.5* 12.0* 6.0 7.9  NEUTROABS 13.0*  --   --   --   --   HGB 15.1 12.3* 11.0* 7.9* 8.5*  HCT 48.3 38.1* 34.5* 27.1* 27.4*  MCV 92.4 87.4 89.6 93.1 91.9  PLT 565* 434* 298 203 214     Coagulation Studies: No results for input(s): LABPROT, INR in the last 72 hours.  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 He is now status post emergent bifrontal craniotomy with evacuation of abscess and subdural empyema with  skull base reconstruction.  Continued to have right upper extremity twitching,not correlating with EEG. However simple partial status can be missed on surface EEG and patient treated with Vimpat andvalproic acidload ( already on VPA and Keppra).Also started on midozalam drip which was weaned off on 11/27/17. No further seizures.   Partial status epilepticus - resolved  Plan Continue current AEDs-Keppra, Vimpat, valproic acid Follow dailyVPAlevels D/C LTM EEG Continue to treat infection   Ryan Bryan [facility] Pager Number [phone] For questions after 7pm please refer to AMION to reach the Neurologist on call

## 2017-11-28 NOTE — Progress Notes (Signed)
100 mg (10 mL) of IV Vimpat left over from compounding. Wasted in the main Musician. Noah Delaine, PharmD, was witness.

## 2017-11-28 NOTE — Progress Notes (Signed)
LTM EEG D/C'd per Dr Aroor. No skin breakdown noted.  

## 2017-11-28 NOTE — Procedures (Signed)
  Signed        Electroencephalography report.  Long-term monitoring  Recording begins 10/22//2019 at 7:23 AM Recording ends to 11/28/2017 at 7:30 AM  CPT 95951 Day 3  Date acquisition: International 10-20 for electrodes placement.  18 channels EEG with additional EKG channel    This EEG was requested this patient to was found unresponsive with convulsions.  Patient is intubated.  Sedation status is not available day 1  Background activities marked by background activity slowing in the delta range predominantly with a superimposed faster frequencies carrying sharply contoured morphology distributed paracentrally.  Occasionally paracentral frontal paracentral left and right sharp waves were present.  This possibly could represent breach rhythm in appropriate clinical setting.  However detailed history is not available There is a one pushbutton activation event around 1127.  Reason for activation of event button is unclear.  There is no description provided.  There was no obvious clinical manifestations to this pushbutton activation events available reviewing the video monitoring.  EEG was accompanied by arousal   Day 2 : No clinical or subclinical seizures present throughout the recording.  Background activity slowing noted predominantly in the mid and delta range.  Persistent electrode artifact at T5 and T 4 present throughout the recording.  Impedances at 10k Ohms Right frontocentral spikes present however given significant electrode artifact throughout the recording these are difficult to interpret.  Faster frequencies again noted in pericentral regions.  Day 3 : No clinical or subclinical seizures present throughout the recording.  State changes noted.  Continuous delta frequencies alternating with faster frequencies reaching 6 to 7 cps were noted particularly during the last few hours of the recording.   Clinical interpretation: This day 3of intensive EEG monitoring with  simultaneous video monitoring did not record any clinical or subclinical seizures.  Background activities marked by predominantly delta slowing with superimposed faster frequencies with  sharply contoured morphology distributed more anteriorly and paracentrally.  State changes were noted.  Background activities appear to be faster as recording progress.  Clinical correlation is advised.

## 2017-11-28 NOTE — Progress Notes (Signed)
Pt placed on CPAP/PS 5/5 by MD, pt tolerating well.  Goal is to extubate later today as tolerated

## 2017-11-29 ENCOUNTER — Inpatient Hospital Stay: Payer: Self-pay

## 2017-11-29 DIAGNOSIS — E8770 Fluid overload, unspecified: Secondary | ICD-10-CM

## 2017-11-29 DIAGNOSIS — E876 Hypokalemia: Secondary | ICD-10-CM

## 2017-11-29 LAB — GLUCOSE, CAPILLARY
GLUCOSE-CAPILLARY: 124 mg/dL — AB (ref 70–99)
GLUCOSE-CAPILLARY: 130 mg/dL — AB (ref 70–99)
Glucose-Capillary: 80 mg/dL (ref 70–99)
Glucose-Capillary: 89 mg/dL (ref 70–99)
Glucose-Capillary: 197 mg/dL — ABNORMAL HIGH (ref 70–99)

## 2017-11-29 LAB — CULTURE, BLOOD (ROUTINE X 2)
CULTURE: NO GROWTH
Culture: NO GROWTH
SPECIAL REQUESTS: ADEQUATE
Special Requests: ADEQUATE

## 2017-11-29 LAB — BASIC METABOLIC PANEL
Anion gap: 8 (ref 5–15)
CALCIUM: 8 mg/dL — AB (ref 8.9–10.3)
CO2: 25 mmol/L (ref 22–32)
CREATININE: 0.55 mg/dL — AB (ref 0.61–1.24)
Chloride: 109 mmol/L (ref 98–111)
GFR calc Af Amer: >60 mL/min (ref >60)
GFR calc non Af Amer: >60 mL/min (ref >60)
Glucose, Bld: 87 mg/dL (ref 70–99)
Potassium: 3 mmol/L — ABNORMAL LOW (ref 3.5–5.1)
SODIUM: 142 mmol/L (ref 135–145)

## 2017-11-29 LAB — CBC
HCT: 27.1 % — ABNORMAL LOW (ref 39.0–52.0)
HEMOGLOBIN: 8.3 g/dL — AB (ref 13.0–17.0)
MCH: 27.3 pg (ref 26.0–34.0)
MCHC: 30.6 g/dL (ref 30.0–36.0)
MCV: 89.1 fL (ref 80.0–100.0)
Platelets: 241 10*3/uL (ref 150–400)
RBC: 3.04 MIL/uL — AB (ref 4.22–5.81)
RDW: 13.9 % (ref 11.5–15.5)
WBC: 7.4 10*3/uL (ref 4.0–10.5)
nRBC: 0 % (ref 0.0–0.2)

## 2017-11-29 LAB — VANCOMYCIN, TROUGH: VANCOMYCIN TR: 5 ug/mL — AB (ref 15–20)

## 2017-11-29 MED ORDER — VANCOMYCIN HCL 10 G IV SOLR
1250.0000 mg | Freq: Four times a day (QID) | INTRAVENOUS | Status: DC
  Administered 2017-11-29 – 2017-12-03 (×14): 1250 mg via INTRAVENOUS
  Filled 2017-11-29 (×17): qty 1250

## 2017-11-29 MED ORDER — INSULIN ASPART 100 UNIT/ML ~~LOC~~ SOLN
0.0000 [IU] | Freq: Three times a day (TID) | SUBCUTANEOUS | Status: DC
  Administered 2017-11-29: 3 [IU] via SUBCUTANEOUS
  Administered 2017-11-29: 2 [IU] via SUBCUTANEOUS
  Administered 2017-11-30: 3 [IU] via SUBCUTANEOUS

## 2017-11-29 MED ORDER — POTASSIUM CHLORIDE 10 MEQ/100ML IV SOLN
10.0000 meq | INTRAVENOUS | Status: AC
  Administered 2017-11-29 (×4): 10 meq via INTRAVENOUS
  Filled 2017-11-29 (×4): qty 100

## 2017-11-29 MED ORDER — VANCOMYCIN HCL 10 G IV SOLR
1250.0000 mg | Freq: Four times a day (QID) | INTRAVENOUS | Status: DC
  Filled 2017-11-29: qty 1250

## 2017-11-29 MED ORDER — ASPIRIN EC 325 MG PO TBEC
325.0000 mg | DELAYED_RELEASE_TABLET | Freq: Every day | ORAL | Status: DC
  Administered 2017-11-29: 325 mg via ORAL
  Filled 2017-11-29: qty 1

## 2017-11-29 MED ORDER — HYDRALAZINE HCL 20 MG/ML IJ SOLN: 5.0000 mg | Freq: Four times a day (QID) | INTRAMUSCULAR | Status: DC | PRN

## 2017-11-29 MED ORDER — POTASSIUM CHLORIDE CRYS ER 20 MEQ PO TBCR
40.0000 meq | EXTENDED_RELEASE_TABLET | Freq: Three times a day (TID) | ORAL | Status: AC
  Administered 2017-11-29 (×2): 40 meq via ORAL
  Filled 2017-11-29 (×2): qty 2

## 2017-11-29 MED ORDER — FUROSEMIDE 10 MG/ML IJ SOLN
20.0000 mg | Freq: Three times a day (TID) | INTRAMUSCULAR | Status: AC
  Administered 2017-11-29 (×2): 20 mg via INTRAVENOUS
  Filled 2017-11-29 (×2): qty 2
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
## 2017-11-29 NOTE — Progress Notes (Signed)
Pharmacy Antibiotic Note  Ryan Bryan is a 42 y.o. male admitted on 11/24/2017 with brain abscess. CT head finding 3 separate subdural empyemas with marked adjacent vasogenic edema with improved extraconal abscess. S/P bilateral craniotomy for infection evacuation. Surgical cultures   remain NGTD. WBC stable at 7.4 and patient afebrile. SCr has been stable at 0.55. Vancomycin trough this AM came back subtherapeutic at 5 on 1250 mg every 8 hours. Trough was drawn appropriately. Will increase vancomycin to Q 6 hour dosing, would expect that patient will start to accumulate.   Plan: - Increase Vancomycin to 1250 mg Q 6 hours  -Ceftriaxone 2 gm every 12 hours  -Metronidazole 500 mg IV q8h -Will schedule Vancomycin trough at new steady state    Height: 6\' 1"  (185.4 cm) Weight: 264 lb 1.8 oz (119.8 kg) IBW/kg (Calculated) : 79.9  Temp (24hrs), Avg:99.1 F (37.3 C), Min:98.5 F (36.9 C), Max:99.5 F (37.5 C)  Recent Labs  Lab 11/24/17 1915 11/24/17 2232 11/25/17 0424 11/26/17 0405 11/27/17 0539 11/27/17 1049 11/28/17 0430 11/28/17 1048 11/29/17 0402 11/29/17 0846  WBC  --   --  15.5* 12.0*  --   --  6.0 7.9 7.4  --   CREATININE  --   --  0.83 0.75  --  0.58* 0.54*  --  0.55*  --   LATICACIDVEN 2.9* 2.8*  --   --   --   --   --   --   --   --   VANCOTROUGH  --   --   --   --  <4*  --   --   --   --  5*     Antimicrobials this admission: Vancomycin 10/19 >>  Metronidazole 10/19 >>  Ceftriaxone 10/19>>10/20, 10/23>> Cefepime 10/20>>10/23  Dose adjustments this admission: 10/22 vancomycin trough: 4  Microbiology results: Abscess cultures 10/20: NGTD Bcx 10/19: ngtd Ucx 10/19: neg Sputum 10/19: reincubated 10/19 mrsa pcr: neg   Della Goo, PharmD, BCPS Infectious Diseases Clinical Pharmacist Phone: [phone] 11/29/2017 10:31 AM

## 2017-11-29 NOTE — Progress Notes (Signed)
NAME:  Ryan Bryan, MRN:  [id number], DOB:  09-May-1975, LOS: 5 ADMISSION DATE:  11/24/2017, CONSULTATION DATE:  11/24/17 REFERRING MD: Salley Scarlet, EDP, CHIEF COMPLAINT: Status epilepticus  Brief History   42 year old with history of prior cocaine use, cardiac stent, recent eye surgery, sinus surgery for subperiorbital cellulitis at [facility] admitted with status epilepticus secondary to subdural empyema                              Past Medical History  Drug use, coronary artery disease s/p stent, periorbital cellulitis  Significant Hospital Events   10/2- 10/5- Admit to [facility] for left subperiorbital abscess. S/p FESS and orbitotomy by ENT and opthal. Notes in care everywhere 10/19- Admit to [facility] with status epilepticus, intubated in ED 10/21: Still with seizure overnight, Versed bolus and drip reinitiated, continuing full ventilator support until seizure activity subsides, still awaiting culture data 10/22: Starting to wean Versed, no obvious seizure activity currently 10/23: Following commands.  Pressure support volumes excellent, antibiotics continued.  Sedating medications discontinued.  Plan to extubate once mental status allows and cuff leak present  Consults: date of consult/date signed off & final recs:  Neurology 10/19- Neurosurgery 10/19- ID 10/19-  Procedures (surgical and bedside):  ETT 10/19 > 10/20 Bilateral Craniotomy for Infection Evacuation and Skull Base Defect Repair (Bilateral)  Significant Diagnostic Tests:  CT head 10/19- subdural empyema is in the anterior cranial fossa, improved extra conal abscesses in left superior orbit, interval endoscopic sinus surgery, sinusitis. CTA chest, abd, pelvis 10/19-basilar atelectasis, coronary artery disease in the left anterior descending artery.  I have reviewed the images personally. Abscess Cx ([facility]) 11/09/17- Streptococcus constellatus (viridans Streptococcus) (A) UDS + for BDZ, THC  Micro Data:  Blood cultures 10/19  >> Sputum culture 10/19  Antimicrobials:  Vanco 10/19 >> Flagyl 10/19 >> Cefepime 10/19 >>  Subjective:  Extubated and well tolerated No events, no new complaints Mild confusion but able to answer some questions correctly  Objective   Blood pressure (!) 151/82, pulse 77, temperature 99.5 F (37.5 C), temperature source Axillary, resp. rate 17, height 6\' 1"  (1.854 m), weight 119.8 kg, SpO2 96 %.    Vent Mode: PSV;CPAP FiO2 (%):  [30 %-36 %] 36 % Set Rate:  [20 bmp] 20 bmp Vt Set:  [470 mL] 470 mL PEEP:  [5 cmH20] 5 cmH20 Pressure Support:  [5 cmH20] 5 cmH20 Plateau Pressure:  [11 cmH20] 11 cmH20   Intake/Output Summary (Last 24 hours) at 11/29/2017 2130 Last data filed at 11/29/2017 0700 Gross per 24 hour  Intake 2843.11 ml  Output 2710 ml  Net 133.11 ml   Filed Weights   11/25/17 0500 11/26/17 0500 11/27/17 0353  Weight: 124.7 kg 125 kg 119.8 kg    Examination: General: 42 year old male, NAD, mild confusion HEENT: [place], s/p crani, staples in place, PERRL, EOM-I and MMM Pulmonary: CTA bilaterally Cardiac: RRR, Nl S1/S2 and -M/R/G Abdomen: Soft, NT, ND and +BS Extremities: -edema and -tenderness Neuro: Responsive, protecting airway, moving all ext to command GU: Clear yellow  I reviewed CXR myself, no acute disease noted  Resolved Hospital Problem list   Lactic acidosis Anion gap metabolic acidosis  Assessment & Plan:   Status epilepticus: Resolved  plan Anticonvulsants per neurology D/C EEG  Subdural empyema after recent eye, sinus surgery s/p birfrontal craniotomy and drainage Strep constellatus infection at [facility] Vancomycin, Flagyl, and cefepime as directed by infectious disease  Continuing postoperative care per neurosurgery  Serial neuro checks per neuro See abx plan in ID note  Acute respiratory failure secondary to seizures and ineffective airway protection Portable chest x-ray personally reviewed 10/21:Endotracheal tubes in satisfactory  position, there is volume loss at the left base, and right basilar atelectasis -Excellent tidal volume on spontaneous breathing trial Plan Titrate O2 for sat of 88-92% SLP PT OOB Diet per SLP IS Flutter valve  Coronary artery disease status post stent Plan Continue telemetry  Aspirin PR until can pass swallowing No NGT given recent sinus surgery  Anemia of critical illness No evidence of bleeding, however has had significant hemoglobin drop from 11 down to 7.9.  He is 12 L positive so certainly some of this could be dilutional also wonder simply about lab error Plan Repeat CBC Transfuse for hemoglobin less than 7  At risk for fluid and electrolyte imbalance: Hypokalemia Plan Replace potassium BMET in AM Lasix 20 mg IV q8 x2 doses  Disposition / Summary of Today's Plan 11/29/17   OOB to chair, PT, SLP     Code Status: Full Family Communication: Daughter updated 10/19  Labs   CBC: Recent Labs  Lab 11/24/17 1440 11/25/17 0424 11/26/17 0405 11/28/17 0430 11/28/17 1048 11/29/17 0402  WBC 16.9* 15.5* 12.0* 6.0 7.9 7.4  NEUTROABS 13.0*  --   --   --   --   --   HGB 15.1 12.3* 11.0* 7.9* 8.5* 8.3*  HCT 48.3 38.1* 34.5* 27.1* 27.4* 27.1*  MCV 92.4 87.4 89.6 93.1 91.9 89.1  PLT 565* 434* 298 203 214 241    Basic Metabolic Panel: Recent Labs  Lab 11/25/17 0424 11/26/17 0405 11/27/17 1049 11/28/17 0430 11/29/17 0402  NA 139 141 142 143 142  K 4.9 4.0 3.3* 3.2* 3.0*  CL 109 107 107 107 109  CO2 20* 26 30 32 25  GLUCOSE 115* 114* 112* 98 87  BUN 7 5* 5* 7 <5*  CREATININE 0.83 0.75 0.58* 0.54* 0.55*  CALCIUM 7.9* 8.0* 7.9* 7.8* 8.0*  MG 2.2 2.3  --  2.2  --   PHOS 4.4 2.3*  --  2.7  --    GFR: Estimated Creatinine Clearance: 163.2 mL/min (A) (by C-G formula based on SCr of 0.55 mg/dL (L)). Recent Labs  Lab 11/24/17 1915 11/24/17 2232  11/26/17 0405 11/28/17 0430 11/28/17 1048 11/29/17 0402  WBC  --   --    < > 12.0* 6.0 7.9 7.4  LATICACIDVEN 2.9*  2.8*  --   --   --   --   --    < > = values in this interval not displayed.    Liver Function Tests: Recent Labs  Lab 11/24/17 1440 11/28/17 0430  AST 40 15  ALT 24 12  ALKPHOS 84 46  BILITOT 0.7 0.4  PROT 7.7 5.1*  ALBUMIN 3.8 2.2*   No results for input(s): LIPASE, AMYLASE in the last 168 hours. Recent Labs  Lab 11/24/17 1440 11/28/17 1406  AMMONIA 152* 50*    ABG    Component Value Date/Time   PHART 7.396 11/28/2017 0331   PCO2ART 54.3 (H) 11/28/2017 0331   PO2ART 106.0 11/28/2017 0331   HCO3 33.3 (H) 11/28/2017 0331   TCO2 35 (H) 11/28/2017 0331   ACIDBASEDEF 2.8 (H) 11/25/2017 0550   O2SAT 98.0 11/28/2017 0331     Coagulation Profile: No results for input(s): INR, PROTIME in the last 168 hours.  Cardiac Enzymes: Recent Labs  Lab 11/24/17 1915  11/25/17 0424  TROPONINI 0.06* 0.06*    HbA1C: No results found for: HGBA1C  CBG: Recent Labs  Lab 11/28/17 1536 11/28/17 1933 11/28/17 2326 11/29/17 0343 11/29/17 0757  GLUCAP 85 94 85 80 89   Discussed with PCCM-NP and TRH-MD  Alyson Reedy, M.D. [facility] Pulmonary/Critical Care Medicine. Pager: [phone]. After hours pager: [phone].

## 2017-11-29 NOTE — Progress Notes (Signed)
Nutrition Follow-up  DOCUMENTATION CODES:   Obesity unspecified  INTERVENTION:   Encourage PO intake at meals  Magic cup TID with meals, each supplement provides 290 kcal and 9 grams of protein  NUTRITION DIAGNOSIS:   Increased nutrient needs related to post-op healing as evidenced by estimated needs. Ongoing.   GOAL:   Patient will meet greater than or equal to 90% of their needs Progressing.  MONITOR:   PO intake, Supplement acceptance  ASSESSMENT:   Pt with PMH of prior cocaine abuse, cardiac stent, recent admit at [facility] 10/2 - 10/5 for sinus surgery for subperiorbital cellulitis now admitted 10/19 for status epilepticus secondary to subdural empyema. Pt is s/p bifrontal crani, evacuation of bilateral subdural empyemas, exenteration and cranialization of frontal sinus, and pericranial flap harvest 10/20.    Pt discussed during ICU rounds and with RN.  10/23 extubated 10/24 diet advanced to regular    Pt with mild confusion but able to assist with self feeding with SLP.    Diet Order:   Diet Order            Diet regular Room service appropriate? Yes; Fluid consistency: Thin  Diet effective now              EDUCATION NEEDS:   No education needs have been identified at this time  Skin:  Skin Assessment: (head incision)  Last BM:  10/24  Height:   Ht Readings from Last 1 Encounters:  11/24/17 6\' 1"  (1.854 m)    Weight:   Wt Readings from Last 1 Encounters:  11/27/17 119.8 kg    Ideal Body Weight:  83.6 kg  BMI:  Body mass index is 34.85 kg/m.  Estimated Nutritional Needs:   Kcal:  2400-2600  Protein:  120-140 grams  Fluid:  > 2.3 L/day  Kendell Bane RD, LDN, CNSC [phone] Pager [phone] After Hours Pager

## 2017-11-29 NOTE — Progress Notes (Signed)
Pt arrived unit safely from [place].

## 2017-11-29 NOTE — Progress Notes (Signed)
Rehab Admissions Coordinator Note:  Patient was screened by Clois Dupes for appropriateness for an Inpatient Acute Rehab Consult per OT and SLP recommendations.   At this time, we are recommending Inpatient Rehab consult. Please place order for consult if you would like pt considered for admit. Please advise.  Ottie Glazier, RN, MSN Rehab Admissions Coordinator [phone] 11/29/2017 5:27 PM

## 2017-11-29 NOTE — Progress Notes (Signed)
CBG 197. Paged Dr. Molli Knock, verbal order for moderate sliding scale insulin. See assoc orders.

## 2017-11-29 NOTE — Progress Notes (Signed)
[facility] for Infectious Disease  Date of Admission:  11/24/2017     Total days of antibiotics 6         ASSESSMENT/PLAN  Mr. Ryan Bryan is on day 6 of antimicrobial therapy and postop day 4 from bilateral craniotomy for infection evacuation of subdural empyema resulting in status epilepticus.  He has had no further seizures and doing well following extubation.  He has had no fevers or leukocytosis.  Surgical cultures remain without growth to date with respiratory culture showing normal respiratory flora.  He will need continued treatment of 6 weeks of broad-spectrum antibiotics given his central nervous system involvement.  1.  Continue current dose of vancomycin, ceftriaxone, and metronidazole. 2.  Place PICC 3. OPAT orders placed.  4. Follow up in ID clinic in 4 weeks.    Diagnosis: Subdural empyema / status epilepticus  Culture Result: Culture Negative   No Known Allergies  OPAT Orders Discharge antibiotics: Vancomycin, Ceftriaxone, Metronidazole Per pharmacy protocol  Aim for Vancomycin trough 15-20 (unless otherwise indicated) Duration: 6 weeks  End Date: 01/05/18    [facility] Care Per Protocol:  Labs weekly while on IV antibiotics: _X_ CBC with differential _X_ BMP (biweekly) __ CMP __ CRP __ ESR _X_ Vancomycin trough __ CK  _X_ Please pull PIC at completion of IV antibiotics __ Please leave PIC in place until doctor has seen patient or been notified  Fax weekly labs to [phone]  Clinic Follow Up Appt:  11/26 @ 9:30 am with Terri Piedra, NP   Active Problems:   Status epilepticus ([place])   Subdural empyema   . aspirin EC  325 mg Oral Daily  . insulin aspart  0-15 Units Subcutaneous TID WC    SUBJECTIVE:  Ryan Bryan remains afebrile with no leukocytosis.  Awake and alert.  Denies fevers, chills, sweats, or pain at present.  Has just finished eating breakfast.  No Known Allergies   Review of Systems: Review of Systems  Constitutional:  Negative for chills and fever.  Respiratory: Negative for cough, shortness of breath and wheezing.   Cardiovascular: Negative for chest pain and leg swelling.  Gastrointestinal: Negative for abdominal pain, constipation, diarrhea, nausea and vomiting.  Skin: Negative for rash.  Neurological: Negative for dizziness, seizures, weakness and headaches.    OBJECTIVE: Vitals:   11/29/17 1300 11/29/17 1400 11/29/17 1500 11/29/17 1600  BP: 122/74 136/76 136/73 (!) 146/64  Pulse: 79 89 89 95  Resp: (!) 23 (!) 27 (!) 27 (!) 26  Temp:    99.4 F (37.4 C)  TempSrc:    Oral  SpO2: 95% 96% 96% 95%  Weight:      Height:       Body mass index is 34.85 kg/m.  Physical Exam  Constitutional: He is oriented to person, place, and time. He appears well-developed and well-nourished. No distress.  HENT:  Surgical site appears well approximated with staples. Clean and dry. No drainage or odor.   Cardiovascular: Normal rate, regular rhythm, normal heart sounds and intact distal pulses. Exam reveals no gallop and no friction rub.  No murmur heard. Pulmonary/Chest: Effort normal and breath sounds normal. No stridor. No respiratory distress. He has no wheezes. He has no rales. He exhibits no tenderness.  Abdominal: Soft. Bowel sounds are normal. He exhibits no distension. There is no tenderness. There is no rebound and no guarding.  Neurological: He is alert and oriented to person, place, and time.  Skin: Skin is warm and dry.  Psychiatric: He has a normal mood and affect. His behavior is normal. Judgment and thought content normal.    Lab Results Lab Results  Component Value Date   WBC 7.4 11/29/2017   HGB 8.3 (L) 11/29/2017   HCT 27.1 (L) 11/29/2017   MCV 89.1 11/29/2017   PLT 241 11/29/2017    Lab Results  Component Value Date   CREATININE 0.55 (L) 11/29/2017   BUN <5 (L) 11/29/2017   NA 142 11/29/2017   K 3.0 (L) 11/29/2017   CL 109 11/29/2017   CO2 25 11/29/2017    Lab Results    Component Value Date   ALT 12 11/28/2017   AST 15 11/28/2017   ALKPHOS 46 11/28/2017   BILITOT 0.4 11/28/2017     Microbiology: Recent Results (from the past 240 hour(s))  Culture, blood (routine x 2)     Status: None   Collection Time: 11/24/17  7:15 PM  Result Value Ref Range Status   Specimen Description BLOOD BLOOD RIGHT HAND  Final   Special Requests   Final    Blood Culture adequate volume BOTTLES DRAWN AEROBIC ONLY   Culture   Final    NO GROWTH 5 DAYS Performed at [facility], [place] [address], [place], [place] [address]    Report Status 11/29/2017 FINAL  Final  Culture, blood (routine x 2)     Status: None   Collection Time: 11/24/17  7:15 PM  Result Value Ref Range Status   Specimen Description BLOOD BLOOD RIGHT HAND  Final   Special Requests   Final    BOTTLES DRAWN AEROBIC ONLY Blood Culture adequate volume   Culture   Final    NO GROWTH 5 DAYS Performed at [facility], [place] [address] [place], [place] [address]    Report Status 11/29/2017 FINAL  Final  MRSA PCR Screening     Status: None   Collection Time: 11/24/17  7:51 PM  Result Value Ref Range Status   MRSA by PCR NEGATIVE NEGATIVE Final    Comment:        The GeneXpert MRSA Assay (FDA approved for NASAL specimens only), is one component of a comprehensive MRSA colonization surveillance program. It is not intended to diagnose MRSA infection nor to guide or monitor treatment for MRSA infections. Performed at [facility], [place] [address] [place], [place] [address]   Aerobic/Anaerobic Culture (surgical/deep wound)     Status: None (Preliminary result)   Collection Time: 11/25/17  2:02 AM  Result Value Ref Range Status   Specimen Description ABSCESS  Final   Special Requests BRAIN ID A  Final   Gram Stain NO WBC SEEN NO ORGANISMS SEEN   Final   Culture   Final    NO GROWTH 4 DAYS NO ANAEROBES ISOLATED; CULTURE IN PROGRESS FOR 5 DAYS Performed at [facility], [address], [place], [place] [address]    Report Status PENDING  Incomplete  Aerobic/Anaerobic Culture (surgical/deep wound)     Status: None (Preliminary result)   Collection Time: 11/25/17  2:46 AM  Result Value Ref Range Status   Specimen Description ABSCESS  Final   Special Requests BRAIN  ID B  Final   Gram Stain   Final    RARE WBC PRESENT, PREDOMINANTLY PMN NO ORGANISMS SEEN    Culture   Final    NO GROWTH 4 DAYS NO ANAEROBES ISOLATED; CULTURE IN PROGRESS FOR 5 DAYS Performed at [facility]  [facility], [place] [address] [place], [place] [address]    Report Status PENDING  Incomplete  Urine culture     Status: None   Collection Time: 11/25/17  6:01 AM  Result Value Ref Range Status   Specimen Description URINE, CATHETERIZED  Final   Special Requests NONE  Final   Culture   Final    NO GROWTH Performed at [facility], [place] [address] [place], [place] [address]    Report Status 11/26/2017 FINAL  Final  Culture, respiratory (tracheal aspirate)     Status: None   Collection Time: 11/25/17  8:27 AM  Result Value Ref Range Status   Specimen Description TRACHEAL ASPIRATE  Final   Special Requests NONE  Final   Gram Stain   Final    RARE WBC PRESENT,BOTH PMN AND MONONUCLEAR FEW GRAM POSITIVE COCCI IN CLUSTERS    Culture   Final    FEW Consistent with normal respiratory flora. Performed at [facility], [place] [address] [place], [place] [address]    Report Status 11/27/2017 FINAL  Final     Terri Piedra, NP [place] [facility] [phone] Pager  11/29/2017  5:11 PM

## 2017-11-29 NOTE — Progress Notes (Addendum)
  Speech Language Pathology Treatment: Dysphagia  Patient Details Name: Ryan Bryan MRN: [id number] DOB: [birth year] Today's Date: 11/29/2017 Time: 2025-4270 SLP Time Calculation (min) (ACUTE ONLY): 22 min  Assessment / Plan / Recommendation Clinical Impression  Pt making exceptional progress with general strength and awareness today, less cueing needed, pt able to assist with appropriate positioning and self feeding. Vocal quality much clearer. Consecutive sips of water with no overt or subtle signs of aspiration, solids tolerated without incident. Recommend pt initiate a regular diet and thin liquids. No f/u needed for swallowing. See next note for cognitive linguistic assessment.   HPI HPI: 43 year old with history of prior cocaine use, cardiac stent, recent eye surgery, sinus surgery for subperiorbital cellulitis at [facility] admitted with status epilepticus secondary to subdural empyema. 10/5- Admit to [facility] for left subperiorbital abscess. S/p FESS and orbitotomy by ENT and opthal. 10/19- Admit to [facility] with status epilepticus, secondary to cerebritis and bilateral frontal empyema. He is now status post emergent bifrontal craniotomy with evacuation of abscess and subdural empyema with skull base reconstruction.   intubated in ED 10/19. Extubated on 10/23.       SLP Plan  All goals met       Recommendations  Diet recommendations: Regular;Thin liquid Liquids provided via: Cup;Straw Medication Administration: Whole meds with liquid Supervision: Staff to assist with self feeding(set up assist) Compensations: Minimize environmental distractions;Slow rate;Small sips/bites Postural Changes and/or Swallow Maneuvers: Seated upright 90 degrees                Oral Care Recommendations: Oral care BID Follow up Recommendations: CIR SLP Visit Diagnosis: Dysphagia, oropharyngeal phase (R13.12) Plan: All goals met       GO               Ryan Baltimore, MA CCC-SLP  Acute Rehabilitation  Services Pager [phone] Office [phone]  Ryan Bryan 11/29/2017, 9:01 AM
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
## 2017-11-29 NOTE — Progress Notes (Signed)
Reason for consult:   Subjective: Patient is extubated, doing well. No neurological deficits.    ROS: negative except above  Examination  Vital signs in last 24 hours: Temp:  [97.5 F (36.4 C)-99.5 F (37.5 C)] 97.5 F (36.4 C) (10/24 1200) Pulse Rate:  [72-96] 89 (10/24 1400) Resp:  [7-27] 27 (10/24 1400) BP: (122-175)/(62-90) 136/76 (10/24 1400) SpO2:  [92 %-100 %] 96 % (10/24 1400) Arterial Line BP: (131-192)/(54-74) 181/74 (10/24 0800)  General: lying in bed, comfortable CVS: pulse-normal rate and rhythm RS: breathing comfortably Extremities: normal   Neuro: MS: Alert, oriented, follows commands CN: pupils equal and reactive,  EOMI, face symmetric, tongue midline, normal sensation over face, Motor: 5/5 strength in all 4 extremities Reflexes: 2+ bilaterally over patella, biceps, plantars: flexor Coordination: normal Gait: not tested  Basic Metabolic Panel: Recent Labs  Lab 11/25/17 0424 11/26/17 0405 11/27/17 1049 11/28/17 0430 11/29/17 0402  NA 139 141 142 143 142  K 4.9 4.0 3.3* 3.2* 3.0*  CL 109 107 107 107 109  CO2 20* 26 30 32 25  GLUCOSE 115* 114* 112* 98 87  BUN 7 5* 5* 7 <5*  CREATININE 0.83 0.75 0.58* 0.54* 0.55*  CALCIUM 7.9* 8.0* 7.9* 7.8* 8.0*  MG 2.2 2.3  --  2.2  --   PHOS 4.4 2.3*  --  2.7  --     CBC: Recent Labs  Lab 11/24/17 1440 11/25/17 0424 11/26/17 0405 11/28/17 0430 11/28/17 1048 11/29/17 0402  WBC 16.9* 15.5* 12.0* 6.0 7.9 7.4  NEUTROABS 13.0*  --   --   --   --   --   HGB 15.1 12.3* 11.0* 7.9* 8.5* 8.3*  HCT 48.3 38.1* 34.5* 27.1* 27.4* 27.1*  MCV 92.4 87.4 89.6 93.1 91.9 89.1  PLT 565* 434* 298 203 214 241     Coagulation Studies: No results for input(s): LABPROT, INR in the last 72 hours.  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 He is now status post emergent bifrontal craniotomy with evacuation  of abscess and subdural empyema with skull base reconstruction.  Continued to have right upper extremity twitching,not correlating with EEG. However simple partial status can be missed on surface EEG and patient treated with Vimpat andvalproic acidload ( already on VPA and Keppra).Also started on midozalam drip which was weaned off on 11/27/17. No further seizures.   Partial status epilepticus- resolved  Plan Continue current AEDs-Keppra, Vimpat, valproic acid Continue to treat infection Check CBC, LFT every 2-3 months  Follow VPA levels every 2-3 days while on antibiotics No driving x6 months on discharge, seizure precuations  Will need outpatient neurology follow up    Neurology will sign off. Thanks for the consult.    Georgiana Spinner Aroor [facility] Pager Number [phone] For questions after 7pm please refer to AMION to reach the Neurologist on call

## 2017-11-29 NOTE — Progress Notes (Signed)
PHARMACY CONSULT NOTE FOR:  OUTPATIENT  PARENTERAL ANTIBIOTIC THERAPY (OPAT)  Indication: Subdural empyema / status epilepticus Regimen:  Flagyl 500mg  IV q 8 hrs Rocephin 2g IV q 12 hrs,  Vancomycin 1250mg  IV q 6hrs (will add 10/25 after trough completed)  End date:  01/05/18  IV antibiotic discharge orders are pended. To discharging provider:  please sign these orders via discharge navigator,  Select New Orders & click on the button choice - Manage This Unsigned Work.     Thank you for allowing pharmacy to be a part of this patient's care.  Braylan Faul S. Merilynn Finland, PharmD, BCPS Clinical Staff Pharmacist Misty Stanley Stillinger 11/29/2017, 6:10 PM
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
## 2017-11-30 LAB — BASIC METABOLIC PANEL
Anion gap: 8 (ref 5–15)
BUN: 7 mg/dL (ref 6–20)
CALCIUM: 8.3 mg/dL — AB (ref 8.9–10.3)
CO2: 26 mmol/L (ref 22–32)
CREATININE: 0.58 mg/dL — AB (ref 0.61–1.24)
Chloride: 107 mmol/L (ref 98–111)
GFR calc Af Amer: >60 mL/min (ref >60)
GLUCOSE: 97 mg/dL (ref 70–99)
Potassium: 2.9 mmol/L — ABNORMAL LOW (ref 3.5–5.1)
SODIUM: 141 mmol/L (ref 135–145)

## 2017-11-30 LAB — PHOSPHORUS: Phosphorus: 2.4 mg/dL — ABNORMAL LOW (ref 2.5–4.6)

## 2017-11-30 LAB — AEROBIC/ANAEROBIC CULTURE (SURGICAL/DEEP WOUND): GRAM STAIN: NONE SEEN

## 2017-11-30 LAB — GLUCOSE, CAPILLARY
GLUCOSE-CAPILLARY: 111 mg/dL — AB (ref 70–99)
Glucose-Capillary: 179 mg/dL — ABNORMAL HIGH (ref 70–99)
Glucose-Capillary: 87 mg/dL (ref 70–99)
Glucose-Capillary: 93 mg/dL (ref 70–99)
Glucose-Capillary: 98 mg/dL (ref 70–99)

## 2017-11-30 LAB — AEROBIC/ANAEROBIC CULTURE W GRAM STAIN (SURGICAL/DEEP WOUND)

## 2017-11-30 LAB — CBC
HCT: 28.3 % — ABNORMAL LOW (ref 39.0–52.0)
HEMOGLOBIN: 9 g/dL — AB (ref 13.0–17.0)
MCH: 27.9 pg (ref 26.0–34.0)
MCHC: 31.8 g/dL (ref 30.0–36.0)
MCV: 87.6 fL (ref 80.0–100.0)
Platelets: 287 10*3/uL (ref 150–400)
RBC: 3.23 MIL/uL — ABNORMAL LOW (ref 4.22–5.81)
RDW: 14.2 % (ref 11.5–15.5)
WBC: 6 10*3/uL (ref 4.0–10.5)
nRBC: 0 % (ref 0.0–0.2)

## 2017-11-30 LAB — MAGNESIUM: Magnesium: 2 mg/dL (ref 1.7–2.4)

## 2017-11-30 LAB — VANCOMYCIN, TROUGH: Vancomycin Tr: 8 ug/mL — ABNORMAL LOW (ref 15–20)

## 2017-11-30 MED ORDER — SODIUM CHLORIDE 0.9% FLUSH
10.0000 mL | Freq: Two times a day (BID) | INTRAVENOUS | Status: DC
  Administered 2017-11-30 – 2017-12-03 (×6): 10 mL

## 2017-11-30 MED ORDER — SODIUM CHLORIDE 0.9% FLUSH
10.0000 mL | INTRAVENOUS | Status: DC | PRN
  Administered 2017-12-03: 10 mL
  Filled 2017-11-30: qty 40

## 2017-11-30 MED ORDER — METRONIDAZOLE 500 MG PO TABS
500.0000 mg | ORAL_TABLET | Freq: Three times a day (TID) | ORAL | Status: DC
  Administered 2017-11-30 – 2017-12-03 (×10): 500 mg via ORAL
  Filled 2017-11-30 (×10): qty 1

## 2017-11-30 MED ORDER — POTASSIUM CHLORIDE CRYS ER 20 MEQ PO TBCR
40.0000 meq | EXTENDED_RELEASE_TABLET | Freq: Two times a day (BID) | ORAL | Status: AC
  Administered 2017-11-30 – 2017-12-01 (×2): 40 meq via ORAL
  Filled 2017-11-30 (×2): qty 2

## 2017-11-30 NOTE — Progress Notes (Signed)
Occupational Therapy Treatment Patient Details Name: Ryan Bryan MRN: [id number] DOB: Apr 30, 1975 Today's Date: 11/30/2017    History of present illness 41 year old with history of prior cocaine use, cardiac stent, recent eye surgery, sinus surgery for subperiorbital cellulitis at [facility] (sinus surgery s/p birfrontal craniotomy and drainage). Admitted with status epilepticus secondary to subdural empyema. 42 year old with history of prior cocaine use, cardiac stent, recent eye surgery, sinus surgery for subperiorbital cellulitis at [facility] admitted with status epilepticus secondary to subdural empyema.  Intubated 10/19. Extubated 10/23.    OT comments  Pt making significant progress as compared to performance yesterday, however, he demonstrates cognitive impairments in addition to deficits listed below. If pt continues to progress, he may be able to DC home with 24/7 S and [facility] services. Feel pt could reach modified independent level and be able to DC home with intermittent S with intensive rehab at CIR. Will continue to follow.   Follow Up Recommendations  CIR;Supervision/Assistance - 24 hour - pending progress   Equipment Recommendations  3 in 1 bedside commode    Recommendations for Other Services      Precautions / Restrictions Precautions Precautions: Fall       Mobility Bed Mobility Overal bed mobility: Modified Independent                Transfers Overall transfer level: Needs assistance   Transfers: Sit to/from Stand Sit to Stand: Min guard              Balance Overall balance assessment: Needs assistance   Sitting balance-Leahy Scale: Good       Standing balance-Leahy Scale: Fair                       ADL either performed or assessed with clinical judgement   ADL Overall ADL's : Needs assistance/impaired Eating/Feeding: Set up   Grooming: Set up;Supervision/safety;Standing   Upper Body Bathing: Set up;Supervision/ safety;Standing   Lower  Body Bathing: Min guard;Sit to/from stand   Upper Body Dressing : Sitting;Set up;Supervision/safety   Lower Body Dressing: Min guard;Sit to/from stand   Toilet Transfer: Ambulation;Minimal assistance   Toileting- Architect and Hygiene: Supervision/safety       Functional mobility during ADLs: Minimal assistance General ADL Comments: increased ability to complete basic ADL tasks however apparent cognitive deficits. Pt trying to wash face using lotion after reading that the bottle he picked up was lotion instead of soap.There was spray soap on counter.      Vision   Additional Comments: vision appears WFL; ? decreased visual attention   Perception     Praxis      Cognition Arousal/Alertness: Awake/alert Behavior During Therapy: Flat affect Overall Cognitive Status: Impaired/Different from baseline Area of Impairment: Attention;Memory;Following commands;Safety/judgement;Awareness;Problem solving                   Current Attention Level: Selective Memory: Decreased recall of precautions;Decreased short-term memory Following Commands: Follows one step commands with increased time Safety/Judgement: Decreased awareness of safety;Decreased awareness of deficits Awareness: Emergent Problem Solving: Slow processing General Comments: Pt given 3 steps to follow during mobility. Pt unable to dual task. Pt given instructions to look at his room number, ask for a cup at the nurses station , then return to his room. Pt followed 0/3 tasks.         Exercises     Shoulder Instructions       General Comments      Pertinent Vitals/  Pain       Pain Assessment: Faces Faces Pain Scale: Hurts a little bit Pain Location: eyes Pain Descriptors / Indicators: Discomfort;Aching Pain Intervention(s): Limited activity within patient's tolerance  Home Living                                          Prior Functioning/Environment               Frequency  Min 3X/week        Progress Toward Goals  OT Goals(current goals can now be found in the care plan section)  Progress towards OT goals: Progressing toward goals  Acute Rehab OT Goals Patient Stated Goal: to get better OT Goal Formulation: With patient/family Time For Goal Achievement: 12/13/17 Potential to Achieve Goals: Good ADL Goals Pt Will Perform Lower Body Bathing: with modified independence;sit to/from stand Pt Will Perform Lower Body Dressing: with modified independence;sit to/from stand Pt Will Transfer to Toilet: with modified independence;ambulating Pt/caregiver will Perform Home Exercise Program: Left upper extremity;With Supervision;With written HEP provided;Increased strength Additional ADL Goal #1: Pt will demonstrate anticipatory awareness during ADL tasks  Plan Discharge plan remains appropriate;Frequency remains appropriate    Co-evaluation                 AM-PAC PT "6 Clicks" Daily Activity     Outcome Measure   Help from another person eating meals?: A Little Help from another person taking care of personal grooming?: A Little Help from another person toileting, which includes using toliet, bedpan, or urinal?: A Little Help from another person bathing (including washing, rinsing, drying)?: A Little Help from another person to put on and taking off regular upper body clothing?: A Little Help from another person to put on and taking off regular lower body clothing?: A Little 6 Click Score: 18    End of Session Equipment Utilized During Treatment: Gait belt  OT Visit Diagnosis: Other abnormalities of gait and mobility (R26.89);Muscle weakness (generalized) (M62.81);Other symptoms and signs involving cognitive function   Activity Tolerance Patient tolerated treatment well   Patient Left in chair;with call bell/phone within reach;with chair alarm set   Nurse Communication Mobility status        Time: 1130-1200 OT Time  Calculation (min): 30 min  Charges: OT General Charges $OT Visit: 1 Visit OT Treatments $Self Care/Home Management : 23-37 mins  Luisa Dago, OT/L   Acute OT Clinical Specialist Acute Rehabilitation Services Pager [phone] Office [phone]    [facility] 11/30/2017, 4:23 PM

## 2017-11-30 NOTE — Consult Note (Signed)
Physical Medicine and Rehabilitation Consult Reason for Consult: Decreased functional mobility Referring Physician: [facility]   HPI: Ryan Bryan is a 42 y.o. right-handed male with history of prior cocaine use, CAD with cardiac stenting, sinus surgery for sub-periorbital cellulitis at [facility].  Per chart review patient lives with family.  Independent working as a Administrator.  One level home with 5 steps to entry.  Admitted 11/24/2017 status epilepticus secondary to subdural empyema.  Patient placed on nonrebreather bulb mask again with seizure lasting approximately 2 minutes received Ativan and loaded with Keppra.  Cranial CT scan showed 3 separate subdural empyemas in the anterior cranial fossa located bilaterally and in the midline.  CT angiogram of the chest with no evidence of aortic aneurysm or dissection.  Patient did require intubation.  EEG completed with no seizure activity.  Neurology follow-up Vimpat added to Keppra.  Infectious disease consulted currently placed on antibiotic therapies question brain abscess.  Patient was extubated 11/28/2017.  Therapy evaluations completed 11/29/2017 with recommendations of physical medicine rehab consult   Review of Systems  Constitutional: Negative for fever.  HENT: Negative for hearing loss.   Eyes: Positive for blurred vision.  Respiratory: Negative for cough and shortness of breath.   Cardiovascular: Negative for chest pain, palpitations and leg swelling.  Gastrointestinal: Positive for constipation. Negative for nausea and vomiting.  Genitourinary: Negative for dysuria, flank pain and hematuria.  Musculoskeletal: Positive for myalgias.  Skin: Negative for rash.  Neurological: Positive for seizures and headaches.  All other systems reviewed and are negative.  History reviewed. No pertinent past medical history. Past Surgical History:  Procedure Laterality Date  . CRANIOTOMY Bilateral 11/24/2017   Procedure: Bilateral  Craniotomy for Infection Evacuation and Skull Base Defect Repair;  Surgeon: Jadene Pierini, MD;  Location: [facility] OR;  Service: Neurosurgery;  Laterality: Bilateral;   History reviewed. No pertinent family history. Social History:  has no tobacco, alcohol, and drug history on file. Allergies: No Known Allergies Medications Prior to Admission  Medication Sig Dispense Refill  . ibuprofen (ADVIL,MOTRIN) 200 MG tablet Take 400 mg by mouth every 6 (six) hours as needed for fever or headache.      Home: Home Living Family/patient expects to be discharged to:: Private residence Living Arrangements: Children Available Help at Discharge: Family, Available 24 hours/day Type of Home: Mobile home Home Access: Stairs to enter [employer] of Steps: 4-5 Entrance Stairs-Rails: Right, Left Home Layout: One level Bathroom Shower/Tub: Health visitor: Standard Bathroom Accessibility: Yes Home Equipment: Cane - single point  Functional History: Prior Function Level of Independence: Independent Comments: worked as a Teacher, early years/pre Status:  Mobility: Bed Mobility Overal bed mobility: Needs Assistance Bed Mobility: Supine to Sit Supine to sit: Supervision Transfers Overall transfer level: Needs assistance Transfers: Sit to/from Stand Sit to Stand: Min assist General transfer comment: posterior lean initially Ambulation/Gait Ambulation/Gait assistance: Min assist Gait Distance (Feet): 15 Feet(x2) Gait Pattern/deviations: Step-through pattern General Gait Details: short shuffled steps Gait velocity interpretation: <1.31 ft/sec, indicative of household ambulator    ADL: ADL Overall ADL's : Needs assistance/impaired Eating/Feeding: Minimal assistance Grooming: Minimal assistance, Standing Upper Body Bathing: Minimal assistance, Sitting Lower Body Bathing: Moderate assistance, Sit to/from stand Upper Body Dressing : Moderate assistance, Sitting Lower Body  Dressing: Moderate assistance, Sit to/from stand Toilet Transfer: Moderate assistance(at times due to LOB) Toileting- Clothing Manipulation and Hygiene: Moderate assistance Functional mobility during ADLs: Moderate assistance(at times) General ADL Comments: very slow; decreased  attention to task; cues for problem solving; impaired L hand decreasing independence  Cognition: Cognition Overall Cognitive Status: Impaired/Different from baseline Arousal/Alertness: Awake/alert Orientation Level: Oriented to person, Oriented to place, Oriented to time, Oriented to situation Attention: Sustained, Selective Sustained Attention: Appears intact Selective Attention: Impaired Selective Attention Impairment: Verbal basic, Functional basic Memory: Impaired Memory Impairment: Decreased recall of new information Awareness: Appears intact Safety/Judgment: Appears intact Cognition Arousal/Alertness: Awake/alert Behavior During Therapy: Flat affect Overall Cognitive Status: Impaired/Different from baseline Area of Impairment: Attention, Memory, Following commands, Safety/judgement, Awareness, Problem solving Current Attention Level: Sustained Memory: Decreased recall of precautions, Decreased short-term memory Following Commands: Follows one step commands with increased time Safety/Judgement: Decreased awareness of safety, Decreased awareness of deficits Awareness: Emergent Problem Solving: Slow processing General Comments: Pt picked up deoderant then body wash in order to find toothpaste; R bais in spatial task  Blood pressure 136/72, pulse 82, temperature 98.2 F (36.8 C), temperature source Oral, resp. rate 18, height 6\' 1"  (1.854 m), weight 120.5 kg, SpO2 99 %. Physical Exam  Constitutional: He appears well-developed.  HENT:  Head: Normocephalic.  Eyes: Pupils are equal, round, and reactive to light.  Neck: Normal range of motion.  Cardiovascular: Normal rate.  Respiratory: Effort normal.    GI: Soft.  Musculoskeletal: He exhibits no edema.  Neurological:  Alert with fair insight and awareness. Claw hand deformity RUE. Otherwise motor 3-4/5 UE and LE. No focal sensory deficits.     Results for orders placed or performed during the hospital encounter of 11/24/17 (from the past 24 hour(s))  Glucose, capillary     Status: Abnormal   Collection Time: 11/29/17  4:18 PM  Result Value Ref Range   Glucose-Capillary 124 (H) 70 - 99 mg/dL   Comment 1 Notify RN    Comment 2 Document in Chart   Glucose, capillary     Status: Abnormal   Collection Time: 11/29/17  8:19 PM  Result Value Ref Range   Glucose-Capillary 130 (H) 70 - 99 mg/dL   Comment 1 Notify RN    Comment 2 Document in Chart   Glucose, capillary     Status: None   Collection Time: 11/30/17 12:20 AM  Result Value Ref Range   Glucose-Capillary 93 70 - 99 mg/dL   Comment 1 Notify RN    Comment 2 Document in Chart   Glucose, capillary     Status: Abnormal   Collection Time: 11/30/17  4:26 AM  Result Value Ref Range   Glucose-Capillary 111 (H) 70 - 99 mg/dL   Comment 1 Notify RN    Comment 2 Document in Chart   CBC     Status: Abnormal   Collection Time: 11/30/17  4:37 AM  Result Value Ref Range   WBC 6.0 4.0 - 10.5 K/uL   RBC 3.23 (L) 4.22 - 5.81 MIL/uL   Hemoglobin 9.0 (L) 13.0 - 17.0 g/dL   HCT 16.1 (L) 09.6 - 04.5 %   MCV 87.6 80.0 - 100.0 fL   MCH 27.9 26.0 - 34.0 pg   MCHC 31.8 30.0 - 36.0 g/dL   RDW 40.9 81.1 - 91.4 %   Platelets 287 150 - 400 K/uL   nRBC 0.0 0.0 - 0.2 %  Basic metabolic panel     Status: Abnormal   Collection Time: 11/30/17  4:37 AM  Result Value Ref Range   Sodium 141 135 - 145 mmol/L   Potassium 2.9 (L) 3.5 - 5.1 mmol/L   Chloride 107 98 -  111 mmol/L   CO2 26 22 - 32 mmol/L   Glucose, Bld 97 70 - 99 mg/dL   BUN 7 6 - 20 mg/dL   Creatinine, Ser 1.61 (L) 0.61 - 1.24 mg/dL   Calcium 8.3 (L) 8.9 - 10.3 mg/dL   GFR calc non Af Amer >60 >60 mL/min   GFR calc Af Amer >60 >60 mL/min    Anion gap 8 5 - 15  Magnesium     Status: None   Collection Time: 11/30/17  4:37 AM  Result Value Ref Range   Magnesium 2.0 1.7 - 2.4 mg/dL  Phosphorus     Status: Abnormal   Collection Time: 11/30/17  4:37 AM  Result Value Ref Range   Phosphorus 2.4 (L) 2.5 - 4.6 mg/dL  Vancomycin, trough     Status: Abnormal   Collection Time: 11/30/17 10:55 AM  Result Value Ref Range   Vancomycin Tr 8 (L) 15 - 20 ug/mL  Glucose, capillary     Status: Abnormal   Collection Time: 11/30/17 11:23 AM  Result Value Ref Range   Glucose-Capillary 179 (H) 70 - 99 mg/dL   [place] Ekg Site Rite  Result Date: 11/29/2017 If Site Rite image not attached, placement could not be confirmed due to current cardiac rhythm.    Assessment/Plan: Diagnosis: subdural empyema 1. Does the need for close, 24 hr/day medical supervision in concert with the patient's rehab needs make it unreasonable for this patient to be served in a less intensive setting? Yes 2. Co-Morbidities requiring supervision/potential complications: CAD, drug abuse, ID considerations 3. Due to bladder management, bowel management, safety, skin/wound care, disease management, medication administration, pain management and patient education, does the patient require 24 hr/day rehab nursing? Yes 4. Does the patient require coordinated care of a physician, rehab nurse, PT (1-2 hrs/day, 5 days/week), OT (1-2 hrs/day, 5 days/week) and SLP (1-2 hrs/day, 5 days/week) to address physical and functional deficits in the context of the above medical diagnosis(es)? Yes Addressing deficits in the following areas: balance, endurance, locomotion, strength, transferring, bowel/bladder control, bathing, dressing, feeding, grooming, toileting, cognition and psychosocial support 5. Can the patient actively participate in an intensive therapy program of at least 3 hrs of therapy per day at least 5 days per week? Yes 6. The potential for patient to make measurable gains while  on inpatient rehab is excellent 7. Anticipated functional outcomes upon discharge from inpatient rehab are modified independent  with PT, modified independent with OT, modified independent with SLP. 8. Estimated rehab length of stay to reach the above functional goals is: 10-17 days 9. Anticipated D/C setting: Home 10. Anticipated post D/C treatments: HH therapy and Outpatient therapy 11. Overall Rehab/Functional Prognosis: excellent  RECOMMENDATIONS: This patient's condition is appropriate for continued rehabilitative care in the following setting: CIR Patient has agreed to participate in recommended program. Yes Note that insurance prior authorization may be required for reimbursement for recommended care.  Comment: Rehab Admissions Coordinator to follow up.  Thanks,  Ranelle Oyster, MD, [place] [facility]  I have personally performed a face to face diagnostic evaluation of this patient. Additionally, I have reviewed and concur with the physician assistant's documentation above.    Mcarthur Rossetti Angiulli, PA-C 11/30/2017

## 2017-11-30 NOTE — Progress Notes (Signed)
Neurosurgery Service Progress Note  Subjective: No acute events overnight, feeling well this morning  Objective: Vitals:   11/29/17 1746 11/29/17 2017 11/30/17 0006 11/30/17 0428  BP: (!) 153/80 131/77 (!) 121/58 132/76  Pulse: 78 89 79 71  Resp: (!) 22   18  Temp: 100.1 F (37.8 C) 99 F (37.2 C) 98.5 F (36.9 C) 98.5 F (36.9 C)  TempSrc: Oral Oral Oral Oral  SpO2: 97% 99% 97% 96%  Weight:    120.5 kg  Height:       Temp (24hrs), Avg:98.8 F (37.1 C), Min:97.5 F (36.4 C), Max:100.1 F (37.8 C)  CBC Latest Ref Rng & Units 11/30/2017 11/29/2017 11/28/2017  WBC 4.0 - 10.5 K/uL 6.0 7.4 7.9  Hemoglobin 13.0 - 17.0 g/dL 9.0(L) 8.3(L) 8.5(L)  Hematocrit 39.0 - 52.0 % 28.3(L) 27.1(L) 27.4(L)  Platelets 150 - 400 K/uL 287 241 214   BMP Latest Ref Rng & Units 11/30/2017 11/29/2017 11/28/2017  Glucose 70 - 99 mg/dL 97 87 98  BUN 6 - 20 mg/dL 7 <5(A) 7  Creatinine 2.13 - 1.24 mg/dL 0.86(V) 7.84(O) 9.62(X)  Sodium 135 - 145 mmol/L 141 142 143  Potassium 3.5 - 5.1 mmol/L 2.9(L) 3.0(L) 3.2(L)  Chloride 98 - 111 mmol/L 107 109 107  CO2 22 - 32 mmol/L 26 25 32  Calcium 8.9 - 10.3 mg/dL 8.3(L) 8.0(L) 7.8(L)    Intake/Output Summary (Last 24 hours) at 11/30/2017 0734 Last data filed at 11/30/2017 0000 Gross per 24 hour  Intake 1222.6 ml  Output 2600 ml  Net -1377.4 ml    Current Facility-Administered Medications:  .  0.9 %  sodium chloride infusion, , Intravenous, Continuous, Simonne Martinet, NP, Stopped at 11/29/17 1409 .  0.9 %  sodium chloride infusion, , Intravenous, PRN, Merwyn Katos, MD, Last Rate: 10 mL/hr at 11/29/17 1600 .  aspirin EC tablet 325 mg, 325 mg, Oral, Daily, Alyson Reedy, MD, 325 mg at 11/29/17 1234 .  cefTRIAXone (ROCEPHIN) 2 g in sodium chloride 0.9 % 100 mL IVPB, 2 g, Intravenous, Q12H, Judyann Munson, MD, Last Rate: 200 mL/hr at 11/29/17 2244, 2 g at 11/29/17 2244 .  hydrALAZINE (APRESOLINE) injection 5 mg, 5 mg, Intravenous, Q6H PRN,  [place], Emily T, MD .  insulin aspart (novoLOG) injection 0-15 Units, 0-15 Units, Subcutaneous, TID WC, Alyson Reedy, MD, 2 Units at 11/29/17 1821 .  lacosamide (VIMPAT) 100 mg in sodium chloride 0.9 % 25 mL IVPB, 100 mg, Intravenous, Q12H, Rejeana Brock, MD, Last Rate: 70 mL/hr at 11/29/17 2058, 100 mg at 11/29/17 2058 .  levETIRAcetam (KEPPRA) IVPB 1500 mg/ 100 mL premix, 1,500 mg, Intravenous, Q12H, Rejeana Brock, MD, Last Rate: 400 mL/hr at 11/29/17 2000, 1,500 mg at 11/29/17 2000 .  metroNIDAZOLE (FLAGYL) IVPB 500 mg, 500 mg, Intravenous, Q8H, Oretha Milch, MD, Last Rate: 100 mL/hr at 11/30/17 0543, 500 mg at 11/30/17 0543 .  valproate (DEPACON) 800 mg in dextrose 5 % 50 mL IVPB, 800 mg, Intravenous, Q8H, Rejeana Brock, MD, Last Rate: 58 mL/hr at 11/30/17 0506, 800 mg at 11/30/17 0506 .  vancomycin (VANCOCIN) 1,250 mg in sodium chloride 0.9 % 250 mL IVPB, 1,250 mg, Intravenous, Q6H, Judyann Munson, MD, Last Rate: 166.7 mL/hr at 11/30/17 0531, 1,250 mg at 11/30/17 0531   Physical Exam: Extubated, alert/awake/interactive, PERRL, gaze neutral, FCx4, strength 5/5 except chronic weakness/deformity in R hand from prior accident, pt unsure but hand appears like an ulnar claw incision c/d/i  Assessment &  Plan: 42 y.o. man s/p emergent evac of subdural empyema, brain abscess, reconstruction of skull base. -okay for discharge from a neurosurgical standpoint when medically ready -ASA 325 ordered, unclear indication, I am not aware of any events during this admission that need immediate full dose ASA. I d/c'd the order, would prefer pt to be off anti-platelets for 2 weeks after cranial surgery unless there is a strong indication. Pt did have a frontal sinus stent, which does not require anti-platelet medications.  -okay for DVT chemoprophylaxis, prefer unfractionated SQH tid, but okay with prophylactic-dose enoxaparin if primary service prefers  Jadene Pierini   11/30/17 7:34 AM

## 2017-11-30 NOTE — Progress Notes (Signed)
Inpatient Rehabilitation Admissions Coordinator  Therapy continues to recommend an inpt rehab admit prior to d/c home. If Pt/ Family would like him considered for admit to [facility] inpt rehab, please place order for consult to assess if he would be a candidate. Please advise.  Ottie Glazier, RN, MSN Rehab Admissions Coordinator [phone] 11/30/2017 2:03 PM

## 2017-11-30 NOTE — Progress Notes (Signed)
Physical Therapy Treatment Patient Details Name: Ryan Bryan MRN: [id number] DOB: [birth year] Today's Date: 11/30/2017    History of Present Illness 42 year old with history of prior cocaine use, cardiac stent, recent eye surgery, sinus surgery for subperiorbital cellulitis at [facility] (sinus surgery s/p birfrontal craniotomy and drainage). Admitted with status epilepticus secondary to subdural empyema. 41 year old with history of prior cocaine use, cardiac stent, recent eye surgery, sinus surgery for subperiorbital cellulitis at [facility] admitted with status epilepticus secondary to subdural empyema.  Intubated 10/19. Extubated 10/23.     PT Comments    Pt supine on arrival.  Pt pleasant and agreeable to session.  Focused on balance assessment and high level balance activities.  Pt continues to present with gait deviations and minor impairments in balance.  Pt continues to benefit from aggressive therapies at CIR to return home functionally independent.      Follow Up Recommendations  CIR     Equipment Recommendations  Other (comment)(TBA)    Recommendations for Other Services Rehab consult     Precautions / Restrictions Precautions Precautions: Fall Restrictions Weight Bearing Restrictions: No    Mobility  Bed Mobility Overal bed mobility: Modified Independent Bed Mobility: Supine to Sit     Supine to sit: Modified independent (Device/Increase time)        Transfers Overall transfer level: Needs assistance   Transfers: Sit to/from Stand Sit to Stand: Min guard         General transfer comment: min guard for safety  Ambulation/Gait Ambulation/Gait assistance: Min guard Gait Distance (Feet): 200 Feet Assistive device: None Gait Pattern/deviations: Step-through pattern;Shuffle(lateral lean L with gait training.  )     General Gait Details: Pt required assistance to correct lateral lean during gait training.     Stairs Stairs: Yes Stairs assistance: Min  guard Stair Management: Two rails Number of Stairs: 2 General stair comments: min guard for safety.     Wheelchair Mobility    Modified Rankin (Stroke Patients Only)       Balance Overall balance assessment: Needs assistance   Sitting balance-Leahy Scale: Good       Standing balance-Leahy Scale: Fair Standing balance comment: reliant on external support               High Level Balance Comments: NBOS- eyes open, eyes closed x 30 sec each.  Tandem stance R and L x30 sec.  SLS R and L 3 sec each.   Standardized Balance Assessment Standardized Balance Assessment : Dynamic Gait Index   Dynamic Gait Index Level Surface: Mild Impairment Change in Gait Speed: Mild Impairment Gait with Horizontal Head Turns: Mild Impairment Gait with Vertical Head Turns: Mild Impairment Gait and Pivot Turn: Mild Impairment Step Over Obstacle: Mild Impairment Step Around Obstacles: Mild Impairment Steps: Mild Impairment Total Score: 16      Cognition Arousal/Alertness: Awake/alert Behavior During Therapy: Flat affect Overall Cognitive Status: Impaired/Different from baseline Area of Impairment: Attention;Memory;Following commands;Safety/judgement;Awareness;Problem solving                   Current Attention Level: Selective Memory: Decreased recall of precautions;Decreased short-term memory Following Commands: Follows one step commands with increased time Safety/Judgement: Decreased awareness of safety;Decreased awareness of deficits Awareness: Emergent Problem Solving: Slow processing General Comments: Pt unable to perform simple tasks without reminders, but when asked what hen forgot to do he was able to recall the task.        Exercises General Exercises - Lower Extremity Hip Flexion/Marching: AROM;Both;10 reps;Standing  Heel Raises: AROM;Both;10 reps;Standing    General Comments        Pertinent Vitals/Pain Pain Assessment: No/denies pain Faces Pain Scale:  Hurts a little bit Pain Location: eyes Pain Descriptors / Indicators: Discomfort;Aching Pain Intervention(s): Limited activity within patient's tolerance    Home Living                      Prior Function            PT Goals (current goals can now be found in the care plan section) Acute Rehab PT Goals Patient Stated Goal: to get better Potential to Achieve Goals: Good Progress towards PT goals: Progressing toward goals    Frequency    Min 4X/week      PT Plan Current plan remains appropriate    Co-evaluation              AM-PAC PT "6 Clicks" Daily Activity  Outcome Measure  Difficulty turning over in bed (including adjusting bedclothes, sheets and blankets)?: None Difficulty moving from lying on back to sitting on the side of the bed? : None Difficulty sitting down on and standing up from a chair with arms (e.g., wheelchair, bedside commode, etc,.)?: Unable Help needed moving to and from a bed to chair (including a wheelchair)?: A Little Help needed walking in hospital room?: A Little Help needed climbing 3-5 steps with a railing? : A Little 6 Click Score: 18    End of Session Equipment Utilized During Treatment: Gait belt Activity Tolerance: Patient tolerated treatment well;Other (comment) Patient left: with call bell/phone within reach;in bed;with bed alarm set Nurse Communication: Mobility status PT Visit Diagnosis: Unsteadiness on feet (R26.81);Other abnormalities of gait and mobility (R26.89);Other symptoms and signs involving the nervous system (R29.898)     Time: 1610-9604 PT Time Calculation (min) (ACUTE ONLY): 17 min  Charges:  $Gait Training: 8-22 mins                    Joycelyn Rua, PTA Acute Rehabilitation Services Pager [phone] Office [phone]     Evette Diclemente Artis Delay 11/30/2017, 4:31 PM

## 2017-11-30 NOTE — Progress Notes (Signed)
Pharmacy Antibiotic Note  Ryan Bryan is a 42 y.o. male admitted on 11/24/2017 with brain abscess. CT head finding 3 separate subdural empyemas with marked adjacent vasogenic edema with improved extraconal abscess. S/P bilateral craniotomy for infection evacuation. Surgical cultures   remain NGTD. WBC stable at 7.4 and Tmax- . SCr has been stable at 0.58. Vancomycin trough this AM came back subtherapeutic at 8 on 1250 mg every 6 hours.   Will leave dose at 1250 mg Q 6 hours for now and see how patient does over the weekend. Highly suspect that patient will begin to accumulate since on 5 gm Vanc daily. We will plan to collect a peak and a trough on this patient for Monday but will re-assess sooner if any changes in renal function take place.   Plan: -Continue Vancomycin 1250 mg Q 6 hours  -Ceftriaxone 2 gm every 12 hours  -Metronidazole 500 mg PO q8h -Monitor renal function and clinical progression closely   Height: 6\' 1"  (185.4 cm) Weight: 265 lb 10.5 oz (120.5 kg) IBW/kg (Calculated) : 79.9  Temp (24hrs), Avg:98.9 F (37.2 C), Min:98.2 F (36.8 C), Max:100.1 F (37.8 C)  Recent Labs  Lab 11/24/17 1915 11/24/17 2232  11/26/17 0405  11/27/17 1049 11/28/17 0430 11/28/17 1048 11/29/17 0402 11/29/17 0846 11/30/17 0437 11/30/17 1055  WBC  --   --    < > 12.0*  --   --  6.0 7.9 7.4  --  6.0  --   CREATININE  --   --    < > 0.75  --  0.58* 0.54*  --  0.55*  --  0.58*  --   LATICACIDVEN 2.9* 2.8*  --   --   --   --   --   --   --   --   --   --   VANCOTROUGH  --   --   --   --    < >  --   --   --   --  5*  --  8*   < > = values in this interval not displayed.     Antimicrobials this admission: Vancomycin 10/19 >>  Metronidazole 10/19 >>  Ceftriaxone 10/19>>10/20, 10/23>> Cefepime 10/20>>10/23  Dose adjustments this admission: 10/22 vancomycin trough: 4  Microbiology results: Abscess cultures 10/20: NGTD Bcx 10/19: ngtd Ucx 10/19: neg Sputum 10/19:  reincubated 10/19 mrsa pcr: neg   Della Goo, PharmD, BCPS Infectious Diseases Clinical Pharmacist Phone: [phone] 11/30/2017 1:41 PM

## 2017-11-30 NOTE — Progress Notes (Signed)
Pharmacy Antibiotic Note  Ryan Bryan is a 42 y.o. male admitted on 11/24/2017 with brain abscess. CT head finding 3 separate subdural empyemas with marked adjacent vasogenic edema with improved extraconal abscess. S/P bilateral craniotomy for infection evacuation. Surgical cultures   remain NGTD. WBC stable at 7.4 and patient afebrile. SCr has been stable at 0.58. Trough has remained low. There is concern for accumulation with the dose increase. Maybe ceftaroline may be feasible in the future. ID would like to keep the current dose for now.   Plan: Continue vancomycin 1.25g IV q6 F/u with bmet  Height: 6\' 1"  (185.4 cm) Weight: 265 lb 10.5 oz (120.5 kg) IBW/kg (Calculated) : 79.9  Temp (24hrs), Avg:98.9 F (37.2 C), Min:98.2 F (36.8 C), Max:100.1 F (37.8 C)  Recent Labs  Lab 11/24/17 1915 11/24/17 2232  11/26/17 0405  11/27/17 1049 11/28/17 0430 11/28/17 1048 11/29/17 0402 11/29/17 0846 11/30/17 0437 11/30/17 1055  WBC  --   --    < > 12.0*  --   --  6.0 7.9 7.4  --  6.0  --   CREATININE  --   --    < > 0.75  --  0.58* 0.54*  --  0.55*  --  0.58*  --   LATICACIDVEN 2.9* 2.8*  --   --   --   --   --   --   --   --   --   --   VANCOTROUGH  --   --   --   --    < >  --   --   --   --  5*  --  8*   < > = values in this interval not displayed.     Antimicrobials this admission: Vancomycin 10/19 >>  Metronidazole 10/19 >>  Ceftriaxone 10/19>>10/20, 10/23>> Cefepime 10/20>>10/23  Dose adjustments this admission: 10/22 vancomycin trough: 4 10/25 vanc trough = 8  Microbiology results: Abscess cultures 10/20: NGTD Bcx 10/19: ngtd Ucx 10/19: neg Sputum 10/19: reincubated 10/19 mrsa pcr: neg  Ryan Bryan, PharmD, BCIDP, AAHIVP, CPP Infectious Disease Pharmacist Pager: [phone] 11/30/2017 1:52 PM

## 2017-11-30 NOTE — Progress Notes (Signed)
PROGRESS NOTE    Ryan Bryan  ZOX:[id number] DOB: 11/25/1975 DOA: 11/24/2017 PCP: Patient, No Pcp Per    Brief Narrative: 42 year old with history of prior cocaine use, cardiac stent, recent eye surgery, sinus surgery for subperiorbital cellulitis at [facility] admitted with status epilepticus secondary to subdural empyema      Assessment & Plan:   Active Problems:   Status epilepticus (HCC)   Subdural empyema   Sub dural  Empyema s/p recent eye and sinus surgery.  Underwent bifrontal craniotomy and drainage. Resume IV antibiotics as per neurology, IV vanco, flagyl and cefepime.  Pt is alert and denies any complaints at this time.    Acute respiratory failure sec to seizures. S/p intubation on 10/19, extubated on 10/23.  He is currently on RA, without any distress  Recommend IS and flutter valve.     Anemia of critical illness.  Transfuse to keep hemoglobin greater than 8.    Hypokalemia: replaced.  Mag level wnl.    DVT prophylaxis: scd's Code Status: full code.  Family Communication: none at bedside.  Disposition Plan: PENDING CIR. Marland Kitchen  10/2- 10/5- Admit to [facility] for left subperiorbital abscess. S/p FESS and orbitotomy by ENT and opthal. Notes in care everywhere 10/19- Admit to [facility] with status epilepticus, intubated in ED 10/21: Still with seizure overnight, Versed bolus and drip reinitiated, continuing full ventilator support until seizure activity subsides, still awaiting culture data 10/22: Starting to wean Versed, no obvious seizure activity currently 10/23: Following commands.  Pressure support volumes excellent, antibiotics continued.  Sedating medications discontinued.  Plan to extubate once mental status allows and cuff leak present 10/25 : transfer the patient to [facility].   Consultants:  Neurology 10/19- signed off on 10/24 Neurosurgery 10/19- signed off 10/24 ID 10/19-     Procedures:  ETT 10/19 > extubated on 10/23.  10/20 Bilateral Craniotomy for  Infection Evacuation and Skull Base Defect Repair (Bilateral)   Antimicrobials:  Vanco 10/19 >> Flagyl 10/19 >> Cefepime 10/19 >>    Subjective: Discussed regarding cir, he said he would rather go home.  But willing to work with CIR for a few days.  Denies any complaints.  No chest pain or sob. No nausea, or vomiting.   Objective: Vitals:   11/30/17 0428 11/30/17 0744 11/30/17 1119 11/30/17 1624  BP: 132/76 130/78 136/72 (!) 150/76  Pulse: 71 72 82 75  Resp: 18 18 18 18   Temp: 98.5 F (36.9 C) 98.9 F (37.2 C) 98.2 F (36.8 C)   TempSrc: Oral Oral Oral   SpO2: 96% 95% 99% 100%  Weight: 120.5 kg     Height:        Intake/Output Summary (Last 24 hours) at 11/30/2017 1857 Last data filed at 11/30/2017 1020 Gross per 24 hour  Intake -  Output 1700 ml  Net -1700 ml   Filed Weights   11/26/17 0500 11/27/17 0353 11/30/17 0428  Weight: 125 kg 119.8 kg 120.5 kg    Examination:  General exam: Appears calm and comfortable s/p craniotomy,  Respiratory system: Clear to auscultation. Respiratory effort normal. Cardiovascular system: S1 & S2 heard, RRR. No JVD, murmurs, rubs, gallops or clicks. No pedal edema. Gastrointestinal system: Abdomen is nondistended, soft and nontender.. Normal bowel sounds heard. Central nervous system: Alert and oriented. Grossly non focal.  Extremities: Symmetric 5 x 5 power. Skin: No rashes, lesions or ulcers Psychiatry: Mood & affect appropriate.     Data Reviewed: I have personally reviewed following labs and imaging studies  CBC: Recent Labs  Lab 11/24/17 1440  11/26/17 0405 11/28/17 0430 11/28/17 1048 11/29/17 0402 11/30/17 0437  WBC 16.9*   < > 12.0* 6.0 7.9 7.4 6.0  NEUTROABS 13.0*  --   --   --   --   --   --   HGB 15.1   < > 11.0* 7.9* 8.5* 8.3* 9.0*  HCT 48.3   < > 34.5* 27.1* 27.4* 27.1* 28.3*  MCV 92.4   < > 89.6 93.1 91.9 89.1 87.6  PLT 565*   < > 298 203 214 241 287   < > = values in this interval not displayed.    Basic Metabolic Panel: Recent Labs  Lab 11/25/17 0424 11/26/17 0405 11/27/17 1049 11/28/17 0430 11/29/17 0402 11/30/17 0437  NA 139 141 142 143 142 141  K 4.9 4.0 3.3* 3.2* 3.0* 2.9*  CL 109 107 107 107 109 107  CO2 20* 26 30 32 25 26  GLUCOSE 115* 114* 112* 98 87 97  BUN 7 5* 5* 7 <5* 7  CREATININE 0.83 0.75 0.58* 0.54* 0.55* 0.58*  CALCIUM 7.9* 8.0* 7.9* 7.8* 8.0* 8.3*  MG 2.2 2.3  --  2.2  --  2.0  PHOS 4.4 2.3*  --  2.7  --  2.4*   GFR: Estimated Creatinine Clearance: 163.5 mL/min (A) (by C-G formula based on SCr of 0.58 mg/dL (L)). Liver Function Tests: Recent Labs  Lab 11/24/17 1440 11/28/17 0430  AST 40 15  ALT 24 12  ALKPHOS 84 46  BILITOT 0.7 0.4  PROT 7.7 5.1*  ALBUMIN 3.8 2.2*   No results for input(s): LIPASE, AMYLASE in the last 168 hours. Recent Labs  Lab 11/24/17 1440 11/28/17 1406  AMMONIA 152* 50*   Coagulation Profile: No results for input(s): INR, PROTIME in the last 168 hours. Cardiac Enzymes: Recent Labs  Lab 11/24/17 1915 11/25/17 0424  TROPONINI 0.06* 0.06*   BNP (last 3 results) No results for input(s): PROBNP in the last 8760 hours. HbA1C: No results for input(s): HGBA1C in the last 72 hours. CBG: Recent Labs  Lab 11/29/17 2019 11/30/17 0020 11/30/17 0426 11/30/17 1123 11/30/17 1706  GLUCAP 130* 93 111* 179* 98   Lipid Profile: Recent Labs    11/27/17 1932  TRIG 110   Thyroid Function Tests: No results for input(s): TSH, T4TOTAL, FREET4, T3FREE, THYROIDAB in the last 72 hours. Anemia Panel: No results for input(s): VITAMINB12, FOLATE, FERRITIN, TIBC, IRON, RETICCTPCT in the last 72 hours. Sepsis Labs: Recent Labs  Lab 11/24/17 1915 11/24/17 2232  LATICACIDVEN 2.9* 2.8*    Recent Results (from the past 240 hour(s))  Culture, blood (routine x 2)     Status: None   Collection Time: 11/24/17  7:15 PM  Result Value Ref Range Status   Specimen Description BLOOD BLOOD RIGHT HAND  Final   Special Requests    Final    Blood Culture adequate volume BOTTLES DRAWN AEROBIC ONLY   Culture   Final    NO GROWTH 5 DAYS Performed at [facility], [address] [place], [place] [address]    Report Status 11/29/2017 FINAL  Final  Culture, blood (routine x 2)     Status: None   Collection Time: 11/24/17  7:15 PM  Result Value Ref Range Status   Specimen Description BLOOD BLOOD RIGHT HAND  Final   Special Requests   Final    BOTTLES DRAWN AEROBIC ONLY Blood Culture adequate volume   Culture  Final    NO GROWTH 5 DAYS Performed at [facility], [address], [place], [place] [address]    Report Status 11/29/2017 FINAL  Final  MRSA PCR Screening     Status: None   Collection Time: 11/24/17  7:51 PM  Result Value Ref Range Status   MRSA by PCR NEGATIVE NEGATIVE Final    Comment:        The GeneXpert MRSA Assay (FDA approved for NASAL specimens only), is one component of a comprehensive MRSA colonization surveillance program. It is not intended to diagnose MRSA infection nor to guide or monitor treatment for MRSA infections. Performed at [facility], [address], [place], [place] [address]   Aerobic/Anaerobic Culture (surgical/deep wound)     Status: None   Collection Time: 11/25/17  2:02 AM  Result Value Ref Range Status   Specimen Description ABSCESS  Final   Special Requests BRAIN ID A  Final   Gram Stain NO WBC SEEN NO ORGANISMS SEEN   Final   Culture   Final    NO ANAEROBES ISOLATED Performed at [facility], [address], [place], [place] [address]    Report Status 11/30/2017 FINAL  Final  Aerobic/Anaerobic Culture (surgical/deep wound)     Status: None   Collection Time: 11/25/17  2:46 AM  Result Value Ref Range Status   Specimen Description ABSCESS  Final   Special Requests BRAIN  ID B  Final   Gram Stain   Final    RARE WBC PRESENT, PREDOMINANTLY PMN NO ORGANISMS SEEN    Culture   Final    NO ANAEROBES ISOLATED Performed at [facility], [address] [place], [place] [address]    Report Status 11/30/2017 FINAL  Final  Urine culture     Status: None   Collection Time: 11/25/17  6:01 AM  Result Value Ref Range Status   Specimen Description URINE, CATHETERIZED  Final   Special Requests NONE  Final   Culture   Final    NO GROWTH Performed at [facility], [address] [place], [place] [address]    Report Status 11/26/2017 FINAL  Final  Culture, respiratory (tracheal aspirate)     Status: None   Collection Time: 11/25/17  8:27 AM  Result Value Ref Range Status   Specimen Description TRACHEAL ASPIRATE  Final   Special Requests NONE  Final   Gram Stain   Final    RARE WBC PRESENT,BOTH PMN AND MONONUCLEAR FEW GRAM POSITIVE COCCI IN CLUSTERS    Culture   Final    FEW Consistent with normal respiratory flora. Performed at [facility], [address], [place], [place] [address]    Report Status 11/27/2017 FINAL  Final         Radiology Studies: [place] Ekg Site Rite  Result Date: 11/29/2017 If Site Rite image not attached, placement could not be confirmed due to current cardiac rhythm.       Scheduled Meds: . insulin aspart  0-15 Units Subcutaneous TID WC  . metroNIDAZOLE  500 mg Oral Q8H  . sodium chloride flush  10-40 mL Intracatheter Q12H   Continuous Infusions: . sodium chloride Stopped (11/29/17 1409)  . sodium chloride 10 mL/hr at 11/29/17 1600  . cefTRIAXone (ROCEPHIN)  IV 2 g (11/30/17 1059)  . lacosamide (VIMPAT) IV 100 mg (11/30/17 1059)  . levETIRAcetam 1,500 mg (11/30/17 1059)  . valproate sodium 800 mg (11/30/17  1536)  . vancomycin 1,250 mg (11/30/17 1431)     LOS: 6 days    Time spent: 35 minutes.     Kathlen Mody, MD [facility] Pager [phone]  If 7PM-7AM, please contact night-coverage [email or web address] Password [email or web address] 11/30/2017, 6:57 PM

## 2017-11-30 NOTE — Care Management Note (Signed)
Case Management Note  Patient Details  Name: Ryan Bryan MRN: [id number] Date of Birth: 09-28-1975  Subjective/Objective:    Pt in with seizures and is s/p crani. He is from home with family.                Action/Plan: Recommendations are for CIR. Awaiting CIR MD to evaluate. CM following for d/c disposition.  Expected Discharge Date:                  Expected Discharge Plan:  IP Rehab Facility  In-House Referral:     Discharge planning Services  CM Consult  Post Acute Care Choice:    Choice offered to:     DME Arranged:    DME Agency:     HH Arranged:    HH Agency:     Status of Service:  In process, will continue to follow  If discussed at Long Length of Stay Meetings, dates discussed:    Additional Comments:  Kermit Balo, RN 11/30/2017, 4:18 PM

## 2017-11-30 NOTE — Progress Notes (Signed)
Peripherally Inserted Central Catheter/Midline Placement  The IV Nurse has discussed with the patient and/or persons authorized to consent for the patient, the purpose of this procedure and the potential benefits and risks involved with this procedure.  The benefits include less needle sticks, lab draws from the catheter, and the patient may be discharged home with the catheter. Risks include, but not limited to, infection, bleeding, blood clot (thrombus formation), and puncture of an artery; nerve damage and irregular heartbeat and possibility to perform a PICC exchange if needed/ordered by physician.  Alternatives to this procedure were also discussed.  Bard Power PICC patient education guide, fact sheet on infection prevention and patient information card has been provided to patient /or left at bedside.    PICC/Midline Placement Documentation  PICC Single Lumen 11/30/17 PICC Right Basilic 43 cm 0 cm (Active)  Indication for Insertion or Continuance of Line Home intravenous therapies (PICC only) 11/30/2017  9:02 AM  Exposed Catheter (cm) 0 cm 11/30/2017  9:02 AM  Site Assessment Clean;Dry;Intact 11/30/2017  9:02 AM  Line Status Flushed;Blood return noted 11/30/2017  9:02 AM  Dressing Type Transparent 11/30/2017  9:02 AM  Dressing Status Clean;Dry;Intact;Antimicrobial disc in place 11/30/2017  9:02 AM  Dressing Intervention New dressing 11/30/2017  9:02 AM  Dressing Change Due 12/07/17 11/30/2017  9:02 AM       Reginia Forts Albarece 11/30/2017, 9:03 AM

## 2017-12-01 LAB — GLUCOSE, CAPILLARY
GLUCOSE-CAPILLARY: 86 mg/dL (ref 70–99)
GLUCOSE-CAPILLARY: 86 mg/dL (ref 70–99)
GLUCOSE-CAPILLARY: 84 mg/dL (ref 70–99)
Glucose-Capillary: 84 mg/dL (ref 70–99)
Glucose-Capillary: 90 mg/dL (ref 70–99)

## 2017-12-01 LAB — BASIC METABOLIC PANEL
ANION GAP: 9 (ref 5–15)
BUN: 5 mg/dL — ABNORMAL LOW (ref 6–20)
CALCIUM: 8.3 mg/dL — AB (ref 8.9–10.3)
CO2: 25 mmol/L (ref 22–32)
CREATININE: 0.55 mg/dL — AB (ref 0.61–1.24)
Chloride: 106 mmol/L (ref 98–111)
GFR calc Af Amer: >60 mL/min (ref >60)
GLUCOSE: 91 mg/dL (ref 70–99)
Potassium: 2.9 mmol/L — ABNORMAL LOW (ref 3.5–5.1)
Sodium: 140 mmol/L (ref 135–145)

## 2017-12-01 MED ORDER — DIVALPROEX SODIUM 500 MG PO DR TAB
800.0000 mg | DELAYED_RELEASE_TABLET | Freq: Three times a day (TID) | ORAL | Status: DC
  Administered 2017-12-01 – 2017-12-03 (×8): 750 mg via ORAL
  Filled 2017-12-01 (×9): qty 1

## 2017-12-01 MED ORDER — POTASSIUM CHLORIDE 10 MEQ/100ML IV SOLN
10.0000 meq | INTRAVENOUS | Status: AC
  Administered 2017-12-01 (×2): 10 meq via INTRAVENOUS
  Filled 2017-12-01 (×2): qty 100

## 2017-12-01 MED ORDER — LACOSAMIDE 50 MG PO TABS
100.0000 mg | ORAL_TABLET | Freq: Two times a day (BID) | ORAL | Status: DC
  Administered 2017-12-01 – 2017-12-03 (×5): 100 mg via ORAL
  Filled 2017-12-01 (×5): qty 2

## 2017-12-01 MED ORDER — LEVETIRACETAM 750 MG PO TABS
1500.0000 mg | ORAL_TABLET | Freq: Two times a day (BID) | ORAL | Status: DC
  Administered 2017-12-01 – 2017-12-03 (×5): 1500 mg via ORAL
  Filled 2017-12-01 (×5): qty 2

## 2017-12-01 MED ORDER — PNEUMOCOCCAL VAC POLYVALENT 25 MCG/0.5ML IJ INJ
0.5000 mL | INJECTION | INTRAMUSCULAR | Status: AC
  Administered 2017-12-02: 0.5 mL via INTRAMUSCULAR
  Filled 2017-12-01: qty 0.5

## 2017-12-01 NOTE — Progress Notes (Signed)
PROGRESS NOTE    Ryan Bryan  ZOX:[id number] DOB: 06/22/1975 DOA: 11/24/2017 PCP: Patient, No Pcp Per    Brief Narrative: 42 year old with history of prior cocaine use, cardiac stent, recent eye surgery, sinus surgery for subperiorbital cellulitis at [facility] admitted with status epilepticus secondary to subdural empyema      Assessment & Plan:   Active Problems:   Status epilepticus (HCC)   Subdural empyema   Sub dural  Empyema s/p recent eye and sinus surgery.  Underwent bifrontal craniotomy and drainage. Resume IV antibiotics as per neurology, IV vanco, flagyl and cefepime.  Pt is alert and denies any complaints at this time.    Seizures  Changed all IV antiepileptic medications to oral.   Acute respiratory failure sec to seizures. S/p intubation on 10/19, extubated on 10/23.  He is currently on RA, without any distress  Recommend IS and flutter valve getting out of bed into the chair and ambulate as per physical therapy.    Anemia of critical illness.  Transfuse to keep hemoglobin greater than 8.  Hemoglobin is around 9   Hypokalemia: replaced.  Repeat potassium in the morning. Mag level wnl.    DVT prophylaxis: scd's Code Status: full code.  Family Communication: none at bedside.  Disposition Plan: PENDING CIR. Marland Kitchen Patient stable for discharge at this time.  10/2- 10/5- Admit to [facility] for left subperiorbital abscess. S/p FESS and orbitotomy by ENT and opthal. Notes in care everywhere 10/19- Admit to [facility] with status epilepticus, intubated in ED 10/21: Still with seizure overnight, Versed bolus and drip reinitiated, continuing full ventilator support until seizure activity subsides, still awaiting culture data 10/22: Starting to wean Versed, no obvious seizure activity currently 10/23: Following commands.  Pressure support volumes excellent, antibiotics continued.  Sedating medications discontinued.  Plan to extubate once mental status allows and cuff leak  present 10/25 : transfer the patient to [facility].   Consultants:  Neurology 10/19- signed off on 10/24 Neurosurgery 10/19- signed off 10/24 ID 10/19-     Procedures:  ETT 10/19 > extubated on 10/23.  10/20 Bilateral Craniotomy for Infection Evacuation and Skull Base Defect Repair (Bilateral)   Antimicrobials:  Vanco 10/19 >> Flagyl 10/19 >> Cefepime 10/19 >>    Subjective: New complaints overnight patient is alert afebrile and comfortable  Objective: Vitals:   11/30/17 2342 12/01/17 0311 12/01/17 0837 12/01/17 1129  BP: (!) 148/80 (!) 126/58 (!) 141/71 134/79  Pulse: 64 (!) 59 72 66  Resp: 18 18 17 15   Temp: 97.9 F (36.6 C) 98.1 F (36.7 C) 98.3 F (36.8 C) 98.3 F (36.8 C)  TempSrc: Oral Oral Oral Oral  SpO2: 98% 97% 100% 100%  Weight:  120.7 kg    Height:       No intake or output data in the 24 hours ending 12/01/17 1309 Filed Weights   11/27/17 0353 11/30/17 0428 12/01/17 0311  Weight: 119.8 kg 120.5 kg 120.7 kg    Examination:  General exam: Appears calm and comfortable s/p craniotomy, no distress  respiratory system: Clear to auscultation. Respiratory effort normal.  No wheezing or rhonchi Cardiovascular system: S1 & S2 heard, RRR. No JVD, Gastrointestinal system: Abdomen is soft, nontender, nondistended.  Bowel sounds are good Central nervous system: Alert and oriented. Grossly non focal.  Extremities: Symmetric 5 x 5 power.  No cyanosis or clubbing Skin: No rashes, lesions or ulcers Psychiatry: Mood & affect appropriate.     Data Reviewed: I have personally reviewed following labs and  imaging studies  CBC: Recent Labs  Lab 11/24/17 1440  11/26/17 0405 11/28/17 0430 11/28/17 1048 11/29/17 0402 11/30/17 0437  WBC 16.9*   < > 12.0* 6.0 7.9 7.4 6.0  NEUTROABS 13.0*  --   --   --   --   --   --   HGB 15.1   < > 11.0* 7.9* 8.5* 8.3* 9.0*  HCT 48.3   < > 34.5* 27.1* 27.4* 27.1* 28.3*  MCV 92.4   < > 89.6 93.1 91.9 89.1 87.6  PLT 565*   < >  298 203 214 241 287   < > = values in this interval not displayed.   Basic Metabolic Panel: Recent Labs  Lab 11/25/17 0424 11/26/17 0405 11/27/17 1049 11/28/17 0430 11/29/17 0402 11/30/17 0437 12/01/17 0506  NA 139 141 142 143 142 141 140  K 4.9 4.0 3.3* 3.2* 3.0* 2.9* 2.9*  CL 109 107 107 107 109 107 106  CO2 20* 26 30 32 25 26 25   GLUCOSE 115* 114* 112* 98 87 97 91  BUN 7 5* 5* 7 <5* 7 5*  CREATININE 0.83 0.75 0.58* 0.54* 0.55* 0.58* 0.55*  CALCIUM 7.9* 8.0* 7.9* 7.8* 8.0* 8.3* 8.3*  MG 2.2 2.3  --  2.2  --  2.0  --   PHOS 4.4 2.3*  --  2.7  --  2.4*  --    GFR: Estimated Creatinine Clearance: 163.7 mL/min (A) (by C-G formula based on SCr of 0.55 mg/dL (L)). Liver Function Tests: Recent Labs  Lab 11/24/17 1440 11/28/17 0430  AST 40 15  ALT 24 12  ALKPHOS 84 46  BILITOT 0.7 0.4  PROT 7.7 5.1*  ALBUMIN 3.8 2.2*   No results for input(s): LIPASE, AMYLASE in the last 168 hours. Recent Labs  Lab 11/24/17 1440 11/28/17 1406  AMMONIA 152* 50*   Coagulation Profile: No results for input(s): INR, PROTIME in the last 168 hours. Cardiac Enzymes: Recent Labs  Lab 11/24/17 1915 11/25/17 0424  TROPONINI 0.06* 0.06*   BNP (last 3 results) No results for input(s): PROBNP in the last 8760 hours. HbA1C: No results for input(s): HGBA1C in the last 72 hours. CBG: Recent Labs  Lab 11/30/17 1706 11/30/17 2026 12/01/17 0017 12/01/17 0408 12/01/17 1129  GLUCAP 98 87 84 90 86   Lipid Profile: No results for input(s): CHOL, HDL, LDLCALC, TRIG, CHOLHDL, LDLDIRECT in the last 72 hours. Thyroid Function Tests: No results for input(s): TSH, T4TOTAL, FREET4, T3FREE, THYROIDAB in the last 72 hours. Anemia Panel: No results for input(s): VITAMINB12, FOLATE, FERRITIN, TIBC, IRON, RETICCTPCT in the last 72 hours. Sepsis Labs: Recent Labs  Lab 11/24/17 1915 11/24/17 2232  LATICACIDVEN 2.9* 2.8*    Recent Results (from the past 240 hour(s))  Culture, blood (routine x  2)     Status: None   Collection Time: 11/24/17  7:15 PM  Result Value Ref Range Status   Specimen Description BLOOD BLOOD RIGHT HAND  Final   Special Requests   Final    Blood Culture adequate volume BOTTLES DRAWN AEROBIC ONLY   Culture   Final    NO GROWTH 5 DAYS Performed at [facility], [address] [place], [place] [address]    Report Status 11/29/2017 FINAL  Final  Culture, blood (routine x 2)     Status: None   Collection Time: 11/24/17  7:15 PM  Result Value Ref Range Status   Specimen Description BLOOD BLOOD RIGHT HAND  Final  Special Requests   Final    BOTTLES DRAWN AEROBIC ONLY Blood Culture adequate volume   Culture   Final    NO GROWTH 5 DAYS Performed at [facility], [address] [place], [place] [address]    Report Status 11/29/2017 FINAL  Final  MRSA PCR Screening     Status: None   Collection Time: 11/24/17  7:51 PM  Result Value Ref Range Status   MRSA by PCR NEGATIVE NEGATIVE Final    Comment:        The GeneXpert MRSA Assay (FDA approved for NASAL specimens only), is one component of a comprehensive MRSA colonization surveillance program. It is not intended to diagnose MRSA infection nor to guide or monitor treatment for MRSA infections. Performed at [facility], [address] [place], [place] [address]   Aerobic/Anaerobic Culture (surgical/deep wound)     Status: None   Collection Time: 11/25/17  2:02 AM  Result Value Ref Range Status   Specimen Description ABSCESS  Final   Special Requests BRAIN ID A  Final   Gram Stain NO WBC SEEN NO ORGANISMS SEEN   Final   Culture   Final    NO ANAEROBES ISOLATED Performed at [facility], [address] [place], [place] [address]    Report Status 11/30/2017 FINAL  Final  Aerobic/Anaerobic Culture (surgical/deep wound)     Status: None   Collection Time: 11/25/17  2:46 AM  Result Value Ref Range Status   Specimen Description ABSCESS  Final   Special Requests BRAIN   ID B  Final   Gram Stain   Final    RARE WBC PRESENT, PREDOMINANTLY PMN NO ORGANISMS SEEN    Culture   Final    NO ANAEROBES ISOLATED Performed at [facility], [address], [place], [place] [address]    Report Status 11/30/2017 FINAL  Final  Urine culture     Status: None   Collection Time: 11/25/17  6:01 AM  Result Value Ref Range Status   Specimen Description URINE, CATHETERIZED  Final   Special Requests NONE  Final   Culture   Final    NO GROWTH Performed at [facility], [address], [place], [place] [address]    Report Status 11/26/2017 FINAL  Final  Culture, respiratory (tracheal aspirate)     Status: None   Collection Time: 11/25/17  8:27 AM  Result Value Ref Range Status   Specimen Description TRACHEAL ASPIRATE  Final   Special Requests NONE  Final   Gram Stain   Final    RARE WBC PRESENT,BOTH PMN AND MONONUCLEAR FEW GRAM POSITIVE COCCI IN CLUSTERS    Culture   Final    FEW Consistent with normal respiratory flora. Performed at [facility], [address] [place], [place] [address]    Report Status 11/27/2017 FINAL  Final         Radiology Studies: [place] Ekg Site Rite  Result Date: 11/29/2017 If Site Rite image not attached, placement could not be confirmed due to current cardiac rhythm.       Scheduled Meds: . divalproex  750 mg Oral Q8H  . insulin aspart  0-15 Units Subcutaneous TID WC  . lacosamide  100 mg Oral BID  . levETIRAcetam  1,500 mg Oral BID  . metroNIDAZOLE  500 mg Oral Q8H  . sodium chloride flush  10-40 mL Intracatheter Q12H   Continuous Infusions: . sodium chloride  Stopped (11/29/17 1409)  . sodium chloride 10 mL/hr at 11/29/17 1600  . cefTRIAXone (ROCEPHIN)  IV 2 g (12/01/17 0038)  . potassium chloride 10 mEq (12/01/17 1215)  . vancomycin 1,250 mg (12/01/17 0906)     LOS: 7 days    Time spent: 32 minutes.     Kathlen Mody, MD [facility] Pager [phone]  If 7PM-7AM, please contact  night-coverage [email or web address] Password [email or web address] 12/01/2017, 1:09 PM

## 2017-12-01 NOTE — Progress Notes (Signed)
Occupational Therapy Treatment Patient Details Name: Ryan Bryan MRN: [id number] DOB: 03-23-1975 Today's Date: 12/01/2017    History of present illness 42 year old with history of prior cocaine use, cardiac stent, recent eye surgery, sinus surgery for subperiorbital cellulitis at [facility] (sinus surgery s/p birfrontal craniotomy and drainage). Admitted with status epilepticus secondary to subdural empyema. 42 year old with history of prior cocaine use, cardiac stent, recent eye surgery, sinus surgery for subperiorbital cellulitis at [facility] admitted with status epilepticus secondary to subdural empyema.  Intubated 10/19. Extubated 10/23.    OT comments  Pt supine in bed, agreeable to in-room session only reporting "I took a long walk this morning".  Pt demonstrates improving balance, activity tolerance and cognition.  Completing mobility with supervision and transfers with modified independence, grooming and LB dressing with supervision.  Following 3 step commands with only minimal cueing to recall 3rd step only and recall of 4 item search with independence. DC plan updated to outpatient OT, patient reports he still needs to ensure his son can assist him at home. Will continue to follow and plan to engage in higher level cognitive training (IADLs with med mgmt, dual cog task, pathfinding, reading directions?) next session.       Follow Up Recommendations  Outpatient OT;Supervision/Assistance - 24 hour    Equipment Recommendations  3 in 1 bedside commode    Recommendations for Other Services      Precautions / Restrictions Precautions Precautions: Fall Restrictions Weight Bearing Restrictions: No       Mobility Bed Mobility Overal bed mobility: Modified Independent Bed Mobility: Supine to Sit     Supine to sit: Modified independent (Device/Increase time)     General bed mobility comments: Pt performed without assistance he is even mindful of his lines and leads and able to manage to  keep them intact.    Transfers Overall transfer level: Modified independent Equipment used: None Transfers: Sit to/from Stand Sit to Stand: Modified independent (Device/Increase time)         General transfer comment: Modified independent, performing multiple reps during session and able to stand at sink with static and dynamic movements with no LOB.      Balance Overall balance assessment: Needs assistance   Sitting balance-Leahy Scale: Good       Standing balance-Leahy Scale: Fair Standing balance comment: reliant on external support                           ADL either performed or assessed with clinical judgement   ADL Overall ADL's : Needs assistance/impaired     Grooming: Wash/dry hands;Supervision/safety;Standing Grooming Details (indicate cue type and reason): washing hands at sink with supervision             Lower Body Dressing: Supervision/safety;Sit to/from stand Lower Body Dressing Details (indicate cue type and reason): able to don/doff socks with supervision, standing with supervision  Toilet Transfer: Ambulation;Modified Independent(simulated in room)           Functional mobility during ADLs: Supervision/safety General ADL Comments: pt engaged in various memory and seqeucning activiites (described above) with min cueing, func mobilty with supervision and no losses of balance     Vision       Perception     Praxis      Cognition Arousal/Alertness: Awake/alert Behavior During Therapy: WFL for tasks assessed/performed Overall Cognitive Status: Impaired/Different from baseline Area of Impairment: Attention;Memory;Following commands;Safety/judgement;Awareness;Problem solving  Current Attention Level: Selective Memory: Decreased recall of precautions;Decreased short-term memory Following Commands: Follows multi-step commands inconsistently;Follows multi-step commands with increased time Safety/Judgement:  Decreased awareness of safety;Decreased awareness of deficits Awareness: Emergent Problem Solving: Slow processing General Comments: pt continues to require increased time to follow commands, able to follow 3 step activity with minimal cueing for final step (wash hands, don/doff socks, and fold a towel) then engaged in 4 item recall scavenger hunt with 100% accuracy and ease of recall        Exercises     Shoulder Instructions       General Comments pt reports feeling "much better today"     Pertinent Vitals/ Pain       Pain Assessment: No/denies pain  Home Living                                          Prior Functioning/Environment              Frequency  Min 3X/week        Progress Toward Goals  OT Goals(current goals can now be found in the care plan section)  Progress towards OT goals: Progressing toward goals  Acute Rehab OT Goals Patient Stated Goal: to get better OT Goal Formulation: With patient Time For Goal Achievement: 12/13/17 Potential to Achieve Goals: Good  Plan Discharge plan needs to be updated;Frequency remains appropriate    Co-evaluation                 AM-PAC PT "6 Clicks" Daily Activity     Outcome Measure   Help from another person eating meals?: None Help from another person taking care of personal grooming?: None Help from another person toileting, which includes using toliet, bedpan, or urinal?: A Little Help from another person bathing (including washing, rinsing, drying)?: A Little Help from another person to put on and taking off regular upper body clothing?: None Help from another person to put on and taking off regular lower body clothing?: A Little 6 Click Score: 21    End of Session Equipment Utilized During Treatment: Gait belt  OT Visit Diagnosis: Other abnormalities of gait and mobility (R26.89);Muscle weakness (generalized) (M62.81);Other symptoms and signs involving cognitive function    Activity Tolerance Patient tolerated treatment well   Patient Left in bed;with call bell/phone within reach   Nurse Communication Mobility status        Time: 1610-9604 OT Time Calculation (min): 12 min  Charges: OT General Charges $OT Visit: 1 Visit OT Treatments $Self Care/Home Management : 8-22 mins  Chancy Milroy, OT Acute Rehabilitation Services Pager [phone] Office [phone]    Chancy Milroy 12/01/2017, 4:21 PM

## 2017-12-01 NOTE — Progress Notes (Addendum)
Physical Therapy Treatment Patient Details Name: Ryan Bryan MRN: [id number] DOB: [birth year] Today's Date: 12/01/2017    History of Present Illness 42 year old with history of prior cocaine use, cardiac stent, recent eye surgery, sinus surgery for subperiorbital cellulitis at [facility] (sinus surgery s/p birfrontal craniotomy and drainage). Admitted with status epilepticus secondary to subdural empyema. 42 year old with history of prior cocaine use, cardiac stent, recent eye surgery, sinus surgery for subperiorbital cellulitis at [facility] admitted with status epilepticus secondary to subdural empyema.  Intubated 10/19. Extubated 10/23.     PT Comments    Pt progressing well and no longer a candidate for CIR therapies.  Pt reports his son can stay with him at d/c.  Pt is now functioning at a Supervision/ MOD I level of assistance.  Plan for continued higher level balance during hospitalization.  Will inform supervising PT of progress and need for change in d/c disposition.      Follow Up Recommendations  Outpatient PT;Supervision for mobility/OOB(OP neuro PT)     Equipment Recommendations  Other (comment)(TBA)    Recommendations for Other Services Rehab consult     Precautions / Restrictions Precautions Precautions: Fall Restrictions Weight Bearing Restrictions: No    Mobility  Bed Mobility Overal bed mobility: Modified Independent Bed Mobility: Supine to Sit     Supine to sit: Modified independent (Device/Increase time)     General bed mobility comments: Pt performed without assistance he is even mindful of his lines and leads and able to manage to keep them intact.    Transfers Overall transfer level: Modified independent Equipment used: None Transfers: Sit to/from Stand Sit to Stand: Modified independent (Device/Increase time)         General transfer comment: Modified independent, performing multiple reps during session and able to stand at sink with static and dynamic  movements with no LOB.    Ambulation/Gait Ambulation/Gait assistance: Supervision;Min guard(intermittent min guard for obstacle negotiation.  ) Gait Distance (Feet): 250 Feet Assistive device: None Gait Pattern/deviations: Step-through pattern;Drifts right/left     General Gait Details: Pt with difficulty negotiating obstacles but no true LOB noted.  Pt with improved gt speed.  Remains to drift.     Stairs Stairs: Yes Stairs assistance: Supervision Stair Management: Two rails;No rails Number of Stairs: 4(x2 with rails and x2 with no rails.  ) General stair comments: supervision for safety.     Wheelchair Mobility    Modified Rankin (Stroke Patients Only)       Balance Overall balance assessment: Needs assistance   Sitting balance-Leahy Scale: Good       Standing balance-Leahy Scale: Fair Standing balance comment: reliant on external support                            Cognition Arousal/Alertness: Awake/alert Behavior During Therapy: WFL for tasks assessed/performed Overall Cognitive Status: Within Functional Limits for tasks assessed                                 General Comments: alert and oriented x4 able to clean him self up after bowel incontinence and change his gown without cueing.  Pt quicker to follow commands and appears to present within normal limits.        Exercises      General Comments        Pertinent Vitals/Pain Pain Assessment: No/denies pain    Home Living  Prior Function            PT Goals (current goals can now be found in the care plan section) Acute Rehab PT Goals Patient Stated Goal: to get better Potential to Achieve Goals: Good Progress towards PT goals: Progressing toward goals    Frequency    Min 4X/week      PT Plan Discharge plan needs to be updated    Co-evaluation              AM-PAC PT "6 Clicks" Daily Activity  Outcome Measure  Difficulty  turning over in bed (including adjusting bedclothes, sheets and blankets)?: None Difficulty moving from lying on back to sitting on the side of the bed? : None Difficulty sitting down on and standing up from a chair with arms (e.g., wheelchair, bedside commode, etc,.)?: None Help needed moving to and from a bed to chair (including a wheelchair)?: None Help needed walking in hospital room?: A Little Help needed climbing 3-5 steps with a railing? : A Little 6 Click Score: 22    End of Session Equipment Utilized During Treatment: Gait belt Activity Tolerance: Patient tolerated treatment well;Other (comment) Patient left: with call bell/phone within reach;in bed;with bed alarm set Nurse Communication: Mobility status PT Visit Diagnosis: Unsteadiness on feet (R26.81);Other abnormalities of gait and mobility (R26.89);Other symptoms and signs involving the nervous system (R29.898)     Time: 1610-9604 PT Time Calculation (min) (ACUTE ONLY): 20 min  Charges:  $Gait Training: 8-22 mins                     Ryan Bryan, PTA Acute Rehabilitation Services Pager [phone] Office [phone]     Ryan Bryan 12/01/2017, 2:26 PM

## 2017-12-02 LAB — CBC
HCT: 33.7 % — ABNORMAL LOW (ref 39.0–52.0)
Hemoglobin: 10.9 g/dL — ABNORMAL LOW (ref 13.0–17.0)
MCH: 28.2 pg (ref 26.0–34.0)
MCHC: 32.3 g/dL (ref 30.0–36.0)
MCV: 87.3 fL (ref 80.0–100.0)
PLATELETS: 358 10*3/uL (ref 150–400)
RBC: 3.86 MIL/uL — ABNORMAL LOW (ref 4.22–5.81)
RDW: 14.7 % (ref 11.5–15.5)
WBC: 8.2 10*3/uL (ref 4.0–10.5)
nRBC: 0 % (ref 0.0–0.2)

## 2017-12-02 LAB — GLUCOSE, CAPILLARY
Glucose-Capillary: 100 mg/dL — ABNORMAL HIGH (ref 70–99)
Glucose-Capillary: 89 mg/dL (ref 70–99)
Glucose-Capillary: 114 mg/dL — ABNORMAL HIGH (ref 70–99)

## 2017-12-02 LAB — BASIC METABOLIC PANEL
Anion gap: 12 (ref 5–15)
BUN: 6 mg/dL (ref 6–20)
CHLORIDE: 104 mmol/L (ref 98–111)
CO2: 25 mmol/L (ref 22–32)
CREATININE: 0.59 mg/dL — AB (ref 0.61–1.24)
Calcium: 8.9 mg/dL (ref 8.9–10.3)
Glucose, Bld: 99 mg/dL (ref 70–99)
POTASSIUM: 3.2 mmol/L — AB (ref 3.5–5.1)
Sodium: 141 mmol/L (ref 135–145)

## 2017-12-02 MED ORDER — LEVETIRACETAM 750 MG PO TABS: 1500.0000 mg | ORAL_TABLET | Freq: Two times a day (BID) | ORAL | 0 refills | Status: DC

## 2017-12-02 MED ORDER — VANCOMYCIN IV (FOR PTA / DISCHARGE USE ONLY): 1250.0000 mg | Freq: Four times a day (QID) | INTRAVENOUS | 0 refills | Status: DC

## 2017-12-02 MED ORDER — METRONIDAZOLE 500 MG PO TABS: 500.0000 mg | ORAL_TABLET | Freq: Three times a day (TID) | ORAL | 0 refills | Status: DC

## 2017-12-02 MED ORDER — DIVALPROEX SODIUM 250 MG PO DR TAB: 800.0000 mg | DELAYED_RELEASE_TABLET | Freq: Three times a day (TID) | ORAL | 0 refills | Status: DC

## 2017-12-02 MED ORDER — LACOSAMIDE 100 MG PO TABS: 100.0000 mg | ORAL_TABLET | Freq: Two times a day (BID) | ORAL | 1 refills | Status: DC

## 2017-12-02 MED ORDER — POTASSIUM CHLORIDE CRYS ER 20 MEQ PO TBCR
40.0000 meq | EXTENDED_RELEASE_TABLET | Freq: Two times a day (BID) | ORAL | Status: AC
  Administered 2017-12-02 (×2): 40 meq via ORAL
  Filled 2017-12-02 (×2): qty 2

## 2017-12-02 MED ORDER — CEFTRIAXONE IV (FOR PTA / DISCHARGE USE ONLY): 2.0000 g | Freq: Two times a day (BID) | INTRAVENOUS | 0 refills | Status: AC

## 2017-12-02 NOTE — Progress Notes (Signed)
Inpatient Rehabilitation Admissions Coordinator  I was contacted by RN CM to clarify pt appropriateness for an inpt rehab admit. Noted OT and PTA have updated their recommendations yesterday to Out pt therapy recommendations due to pt's progress and he is now Mod I to supervision level. Pt not in need of an inpt rehab admit as therapy has changed their recommendations.  Out pt therapy now recommended.  Ottie Glazier, RN, MSN Rehab Admissions Coordinator [phone] 12/02/2017 10:56 AM

## 2017-12-02 NOTE — Progress Notes (Signed)
RN called IV team to look at PICC.  Small amount of bruising noted near antimicrobial disc.  Also small amount of dried blood near top of dressing.  Advised to change dressing tomorrow before discharge since PICC placed 10/25.  If need IV team help, please place consult.

## 2017-12-02 NOTE — Progress Notes (Signed)
Patient ID: Ryan Bryan, male   DOB: 1976/01/08, 42 y.o.   MRN: [id number] Overall patient doing very well minimal headache  Awake alert oriented incision clean dry and intact  Patient is stable for discharge home from a neurosurgical perspective provided all the home health IV antibiotics is arranged probably this means tomorrow but if that's arranged and set up it's okay for him to go later today. Scheduled follow-up Dr. Maurice Small approximate 1-2 weeks.

## 2017-12-02 NOTE — Discharge Summary (Signed)
Physician Discharge Summary  Ryan Bryan ZOX:[id number] DOB: 1975/11/18 DOA: 11/24/2017  PCP: Patient, No Pcp Per  Admit date: 11/24/2017 Discharge date: 12/03/2017  Admitted From: Home.  Disposition: Home.   Recommendations for Outpatient Follow-up:  1. Follow up with PCP in 1-2 weeks 2. Please obtain BMP/CBC in one week Please follow up with neurosurgery Dr Zada Finders in 2 weeks. Please followup with neurology as recommended in 4 weeks.  Please follow up with ID as scheduled.  Please follow up with Home health PT,OT RN.   Discharge Condition:stable.  CODE STATUS: full code.  Diet recommendation: Heart Healthy   Brief/Interim Summary: 42 year old with history of prior cocaine use, cardiac stent, recent eye surgery, sinus surgery for subperiorbital cellulitis at [facility] admitted with status epilepticus secondary to subdural empyema    Events: 10/2- 10/5- Admit to [facility] for left subperiorbital abscess. S/p FESS and orbitotomy by ENT and opthal. Notes in care everywhere 10/19- Admit to [facility] with status epilepticus, intubated in ED 10/21: Still with seizure overnight, Versed bolus and drip reinitiated, continuing full ventilator support until seizure activity subsides, still awaiting culture data 10/22: Starting to wean Versed, no obvious seizure activity currently 10/23: Following commands. Pressure support volumes excellent, antibiotics continued. Sedating medications discontinued. Plan to extubate once mental status allows and cuff leak present 10/25 : transfer the patient to [facility].    Discharge Diagnoses:  Active Problems:   Status epilepticus ([place])   Subdural empyema  Sub dural  Empyema s/p recent eye and sinus surgery.  Underwent bifrontal craniotomy and drainage. Resume IV antibiotics as per neurology, IV vanco, flagyl and cefepime till 11/30  And follow up with neuro surgery and infectious as recommended.  Pt is alert and denies any complaints at this time.     Seizures  Changed all IV antiepileptic medications to oral.   Acute respiratory failure sec to seizures. S/p intubation on 10/19, extubated on 10/23.  He is currently on RA, without any distress  Recommend IS and flutter valve getting out of bed into the chair and ambulate as per physical therapy.    Anemia of critical illness.  Transfuse to keep hemoglobin greater than 8.  Hemoglobin is around 9   Hypokalemia: replaced. Mag level wnl.    Discharge Instructions  Discharge Instructions    Diet - low sodium heart healthy   Complete by:  As directed    Diet - low sodium heart healthy   Complete by:  As directed    Discharge instructions   Complete by:  As directed    Please follow up with neuro surgery and Infectious disease  as recommended   Home infusion instructions [facility] May follow [place] Dosing Protocol; May administer Cathflo as needed to maintain patency of vascular access device.; [place] of vascular access device: per [facility] Protocol: 0.9% NaCl pre/post medica...   Complete by:  As directed    Instructions:  May follow [place] Dosing Protocol   Instructions:  May administer Cathflo as needed to maintain patency of vascular access device.   Instructions:  Flushing of vascular access device: per [facility] Protocol: 0.9% NaCl pre/post medication administration and prn patency; Heparin 100 u/ml, 81m for implanted ports and Heparin 10u/ml, 545mfor all other central venous catheters.   Instructions:  May follow [facility] Anaphylaxis Protocol for First Dose Administration in the home: 0.9% NaCl at 25-50 ml/hr to maintain IV access for protocol meds. Epinephrine 0.3 ml IV/IM PRN and Benadryl 25-50 IV/IM PRN s/s of  anaphylaxis.   Instructions:  [place] Infusion Coordinator (RN) to assist per patient IV care needs in the home PRN.   Home infusion instructions [facility] May follow [place] Dosing Protocol; May administer Cathflo as needed  to maintain patency of vascular access device.; Flushing of vascular access device: per [facility] Protocol: 0.9% NaCl pre/post medica...   Complete by:  As directed    Instructions:  May follow [place] Dosing Protocol   Instructions:  May administer Cathflo as needed to maintain patency of vascular access device.   Instructions:  Flushing of vascular access device: per [facility] Protocol: 0.9% NaCl pre/post medication administration and prn patency; Heparin 100 u/ml, 70m for implanted ports and Heparin 10u/ml, 581mfor all other central venous catheters.   Instructions:  May follow [facility] Anaphylaxis Protocol for First Dose Administration in the home: 0.9% NaCl at 25-50 ml/hr to maintain IV access for protocol meds. Epinephrine 0.3 ml IV/IM PRN and Benadryl 25-50 IV/IM PRN s/s of anaphylaxis.   Instructions:  [place] Coordinator (RN) to assist per patient IV care needs in the home PRN.   Increase activity slowly   Complete by:  As directed      Allergies as of 12/02/2017   No Known Allergies     Medication List    STOP taking these medications   ibuprofen 200 MG tablet Commonly known as:  ADVIL,MOTRIN     TAKE these medications   cefTRIAXone  IVPB Commonly known as:  ROCEPHIN Inject 2 g into the vein every 12 (twelve) hours. Indication:  Subdural empyema / status epilepticus Last Day of Therapy:  01/05/18 Labs - Once weekly:  CBC/D and BMP, Labs - Every other week:  ESR and CRP   divalproex 250 MG DR tablet Commonly known as:  DEPAKOTE Take 3 tablets (750 mg total) by mouth every 8 (eight) hours.   Lacosamide 100 MG Tabs Take 1 tablet (100 mg total) by mouth 2 (two) times daily.   levETIRAcetam 750 MG tablet Commonly known as:  KEPPRA Take 2 tablets (1,500 mg total) by mouth 2 (two) times daily.   metroNIDAZOLE 500 MG tablet Commonly known as:  FLAGYL Take 1 tablet (500 mg total) by mouth every 8 (eight) hours.   vancomycin  IVPB Inject 1,250 mg into the vein  every 6 (six) hours. Indication:  subdural empyema Last Day of Therapy:  01/05/18 Labs - Sunday/Monday:  CBC/D, BMP, and vancomycin trough. Labs - Thursday:  BMP and vancomycin trough Labs - Every other week:  ESR and CRP            Home Infusion Instuctions  (From admission, onward)         Start     Ordered   12/02/17 0000  Home infusion instructions [facility] May follow [place] Protocol; May administer Cathflo as needed to maintain patency of vascular access device.; [place] [facility] vascular [facility]: 0.9% NaCl pre/post medica...    Question Answer Comment  Instructions May follow [place] Protocol   Instructions May administer Cathflo as needed to maintain patency of vascular access device.   Instructions Flushing of vascular access device: per [facility]: 0.9% NaCl pre/post medication administration and prn patency; Heparin 100 u/ml, 35m535mor implanted ports and Heparin 10u/ml, 35ml109mr all other central venous catheters.   Instructions May follow [facility] Anaphylaxis Protocol for First Dose Administration in the home: 0.9% NaCl at 25-50 ml/hr to  maintain IV access for protocol meds. Epinephrine 0.3 ml IV/IM PRN and Benadryl 25-50 IV/IM PRN s/s of anaphylaxis.   Instructions [facility] Infusion Coordinator (RN) to assist per patient IV care needs in the home PRN.      12/02/17 1109   12/02/17 0000  Home infusion instructions [facility] May follow [place] Dosing Protocol; May administer Cathflo as needed to maintain patency of vascular access device.; Flushing of vascular access device: per [facility] Protocol: 0.9% NaCl pre/post medica...    Question Answer Comment  Instructions May follow [place] Dosing Protocol   Instructions May administer Cathflo as needed to maintain patency of vascular access device.   Instructions Flushing of vascular access device: per [facility] Protocol: 0.9% NaCl pre/post medication administration  and prn patency; Heparin 100 u/ml, 53m for implanted ports and Heparin 10u/ml, 53mfor all other central venous catheters.   Instructions May follow [facility] Anaphylaxis Protocol for First Dose Administration in the home: 0.9% NaCl at 25-50 ml/hr to maintain IV access for protocol meds. Epinephrine 0.3 ml IV/IM PRN and Benadryl 25-50 IV/IM PRN s/s of anaphylaxis.   Instructions [facility] Infusion Coordinator (RN) to assist per patient IV care needs in the home PRN.      12/02/17 1130         Follow-up Information    CaGolden CircleFNP Follow up.   Specialties:  Family Medicine, Infectious Diseases Why:  11/26 @ 9:30 am. If you are not able to make this appointment, please contact our office to reschdule.  Contact information: [phone]        OsJudith PartMD. Schedule an appointment as soon as possible for a visit in 2 week(s).   Specialty:  Neurosurgery Contact information: [phone]      [facility] Follow up in 1 month(s).   Contact information: [address]   [place] [phone]       No Known Allergies  Consultations: Neurology 10/19- signed off on 10/24 Neurosurgery 10/19- signed off 10/24 ID [id number]-     Procedures/Studies: Ct Head Wo Contrast  Result Date: 11/25/2017 CLINICAL DATA:  Status post craniotomy with evacuation of abscess and empyema. Frontal sinus cranialization. Skull base defect repair. Evacuation of abscess and empyema. EXAM: CT HEAD WITHOUT CONTRAST TECHNIQUE: Contiguous axial images were obtained from the base of the skull through the vertex without intravenous contrast. COMPARISON:  MRI brain 11/24/2017 FINDINGS: Brain: Bifrontal craniotomy is noted. Bilateral extra-axial collections are evaluated. There is residual edema in the anterior inferior left frontal lobe.  Minimal extra-axial fluid and gas remains. Minimal extra-axial fluid and gas is also present on right, decreased from yesterday. New collections are present. Minimal subfalcine herniation is improved Vascular: No hyperdense vessel or unexpected calcification. Skull: Bifrontal craniotomies are noted. Associated scalp soft tissue swelling is present. Sinuses/Orbits: Left maxillary antrostomy is noted. There is increased fluid in left maxillary sinus. Circumferential mucosal thickening is again seen. Ethmoid mucosal disease is unchanged. Frontal sinuses are cranialized. IMPRESSION: 1. 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 2. Residual edema remains, left greater than right with decreased mass effect and subfalcine herniation. 3. Cranialization of frontal sinuses. Electronically Signed   By: ChSan Morelle.D.   On: 11/25/2017 12:18   Ct Head W & Wo Contrast  Result Date: 11/24/2017 CLINICAL DATA:  Altered level of consciousness, unexplained EXAM: CT HEAD WITHOUT AND WITH CONTRAST TECHNIQUE: Contiguous axial images were obtained from the base of the skull through the vertex without and with intravenous contrast CONTRAST:  45m ISOVUE-370 IOPAMIDOL (ISOVUE-370) INJECTION 76% COMPARISON:  11/04/2017 FINDINGS: Brain: There are rim enhancing subfrontal collections on both sides and in the midline. On the left the collection measures up to 3 cm in diameter and 14 mm in thickness on the right the collection is 2.7 cm in diameter and 12 mm in thickness. The midline collection is along the posterior planum sphenoidale, 2.7 cm in maximum diameter by 7 mm in thickness. These are consistent with subdural empyemas in this patient with complicated sinusitis seen recently. There is marked edema in the bilateral inferior frontal lobes. No hydrocephalus, shift, or herniation. Vascular: Major vessels are enhancing Sinuses/Orbits: Anterior nasal septum perforation. Changes of  endoscopic sinus surgery with improved drainage. There is still active sinusitis, greatest on the left, with maxillary fluid level and extensive frontal opacification. Significantly improved extraconal abscess in the left superior orbit, with trace fluid density seen on coronal reformats measuring only 1 mm in thickness. Reportedly there was eye surgery last week at outside facility. Other: Critical Value/emergent results were called by telephone at the time of interpretation on 11/24/2017 at 5:25 pm to Dr. WBlanchie Dessert, who verbally acknowledged these results. IMPRESSION: 1. Three separate subdural empyemas in the anterior cranial fossa located bilaterally and in the midline. These collections measure up to 3 cm in diameter and 1.4 cm in thickness. Marked adjacent vasogenic edema. 2. Improved extraconal abscess in the superior left orbit with minimal fluid seen on coronal reformats. 3. Interval endoscopic sinus surgery but still active sinusitis Electronically Signed   By: JMonte FantasiaM.D.   On: 11/24/2017 17:28   Mr Brain W And Wo Contrast  Result Date: 11/24/2017 CLINICAL DATA:  Intracranial complications of sinusitis. EXAM: MRI HEAD WITHOUT AND WITH CONTRAST TECHNIQUE: Multiplanar, multiecho pulse sequences of the brain and surrounding structures were obtained without and with intravenous contrast. CONTRAST:  12 mL Gadavist COMPARISON:  CT sinus 11/24/2017 FINDINGS: BRAIN: There are 3 extra-axial collections of the anterior cranial fossa that show internal diffusion restriction. The right most collection measures 3.1 x 1.1 x 3.4 cm. The midline collection measures 0.8 x 3.0 x 0.7 cm. The left most collection measures 3.1 x 2.6 x 1.4 cm. There is a large amount of surrounding vasogenic edema in the frontal lobes, left-greater-than-right. There is no parenchymal contrast enhancement. Otherwise, the brain parenchymal signal is normal. The cerebral and cerebellar volume are age-appropriate.  Susceptibility-sensitive sequences show no chronic microhemorrhage or superficial siderosis. VASCULAR: Major intracranial arterial and venous sinus flow voids are normal. SKULL AND UPPER CERVICAL SPINE: Calvarial bone marrow signal is normal. There is no skull base mass. Visualized upper cervical spine and soft tissues are normal. SINUSES/ORBITS: Status post left ethmoidectomy and left maxillary antrostomy. Moderate residual left maxillary mucosal thickening. Left frontal recess remains occluded. Unchanged mild left proptosis compared to earlier CT. No residual orbital abscess. IMPRESSION: 1. Three anterior cranial fossa subdural empyemas with surrounding vasogenic edema that is worst in the left frontal lobe. The largest collection, on the left, measures 3.1 x 2.6 x 1.4 cm. 2. No cerebral parenchymal contrast enhancement to indicate cerebritis. 3. Status post left maxillary antrostomy and left ethmoidectomy with drainage of extraconal left orbital abscess. Mild residual proptosis. Left frontal recess remains occluded. Electronically Signed  By: Ulyses Jarred M.D.   On: 11/24/2017 22:11   Dg Chest Port 1 View  Result Date: 11/28/2017 CLINICAL DATA:  Pneumonia. EXAM: PORTABLE CHEST 1 VIEW COMPARISON:  Radiograph November 26, 2017. FINDINGS: Stable cardiomediastinal silhouette. Endotracheal and nasogastric tubes are unchanged in position. No pneumothorax is noted. Mild bibasilar subsegmental atelectasis is noted with associated pleural effusions. Bony thorax is unremarkable. IMPRESSION: Mild bibasilar subsegmental atelectasis with associated small pleural effusions. Stable support apparatus. Electronically Signed   By: Marijo Conception, M.D.   On: 11/28/2017 10:39   Dg Chest Port 1 View  Result Date: 11/26/2017 CLINICAL DATA:  Hypoxia EXAM: PORTABLE CHEST 1 VIEW COMPARISON:  November 25, 2017 FINDINGS: Endotracheal tube tip is 4.4 cm above the carina. Nasogastric tube tip and side port are below the diaphragm.  No pneumothorax. There is a small left pleural effusion with atelectatic change in the left base. The right lung is clear. Heart is borderline enlarged with pulmonary vascularity normal. No adenopathy. No bone lesions. IMPRESSION: Tube positions as described without pneumothorax. Small left pleural effusion with left base atelectasis. Right lung clear. Stable cardiac prominence. Electronically Signed   By: Lowella Grip III M.D.   On: 11/26/2017 07:10   Dg Chest Port 1 View  Result Date: 11/25/2017 CLINICAL DATA:  42 year old male with a history of brain abscess EXAM: PORTABLE CHEST 1 VIEW COMPARISON:  11/24/2017 FINDINGS: Cardiomediastinal silhouette unchanged in size and contour. Removal of the defibrillator pads. Enteric tube terminates suitably above the carina, approximately 5.4 cm. Gastric tube has been placed in the interval, terminating out of the field of view. Low lung volumes with patchy opacities at the lung bases similar to prior. No pneumothorax or pleural effusion. IMPRESSION: Low lung volumes with likely atelectasis. Endotracheal tube suitably position above the carina. Interval placement of gastric tube. Electronically Signed   By: Corrie Mckusick D.O.   On: 11/25/2017 08:42   Dg Chest Port 1 View  Result Date: 11/24/2017 CLINICAL DATA:  Status post intubation today. Patient unresponsive. Seizures. EXAM: PORTABLE CHEST 1 VIEW COMPARISON:  None. FINDINGS: New endotracheal tube is in place with the tip at the level of the clavicular heads, 6 cm above the carina. Defibrillator pad is noted. Lungs are clear. Heart size is normal. No pneumothorax or pleural fluid. IMPRESSION: ET tube in good position. Lungs clear. Electronically Signed   By: Inge Rise M.D.   On: 11/24/2017 16:10   Dg Abd Portable 1v  Result Date: 11/24/2017 CLINICAL DATA:  OG tube placement EXAM: PORTABLE ABDOMEN - 1 VIEW COMPARISON:  None. FINDINGS: OG tube coils in the fundus with the tip in the distal stomach.  IMPRESSION: OG tube tip in the distal stomach. Electronically Signed   By: Rolm Baptise M.D.   On: 11/24/2017 20:06   Ct Angio Chest/abd/pel For Dissection W And/or Wo Contrast  Result Date: 11/24/2017 CLINICAL DATA:  Unresponsive.  Back pain.  History of hypertension. EXAM: CT ANGIOGRAPHY CHEST, ABDOMEN AND PELVIS TECHNIQUE: Multidetector CT imaging through the chest, abdomen and pelvis was performed using the standard protocol during bolus administration of intravenous contrast. Multiplanar reconstructed images and MIPs were obtained and reviewed to evaluate the vascular anatomy. CONTRAST:  66m ISOVUE-370 IOPAMIDOL (ISOVUE-370) INJECTION 76% COMPARISON:  None. FINDINGS: CTA CHEST FINDINGS Cardiovascular: Heart is mildly enlarged. Aorta is normal caliber. No dissection. No filling defects in the pulmonary arteries to suggest pulmonary emboli. Coronary artery calcifications in the left anterior descending coronary artery. Mediastinum/Nodes: No mediastinal, hilar,  or axillary adenopathy. Lungs/Pleura: Tendon atelectasis in the lower lobes bilaterally. No effusions. Musculoskeletal: No acute bony abnormality. Review of the MIP images confirms the above findings. CTA ABDOMEN AND PELVIS FINDINGS VASCULAR Aorta: Normal caliber. Scattered minimal infrarenal calcifications. No dissection. Celiac: Widely patent SMA: Widely patent Renals: Single bilaterally, widely patent IMA: Widely patent Inflow: Normal caliber, no dissection. Veins: Unremarkable. Review of the MIP images confirms the above findings. NON-VASCULAR Hepatobiliary: No focal hepatic abnormality. Gallbladder unremarkable. Pancreas: No focal abnormality or ductal dilatation. Spleen: No focal abnormality.  Normal size. Adrenals/Urinary Tract: No adrenal abnormality. No focal renal abnormality. No stones or hydronephrosis. Urinary bladder is unremarkable. Stomach/Bowel: Mild gaseous distention of the stomach. Large and small bowel unremarkable. Lymphatic: No  adenopathy. Reproductive: No visible focal abnormality. Other: No free fluid or free air. Moderate-sized left inguinal hernia containing fat. Musculoskeletal: No acute bony abnormality. Degenerative disc disease at L4-5 with disc space narrowing and spurring. Review of the MIP images confirms the above findings. IMPRESSION: No evidence of aortic aneurysm or dissection. Coronary artery disease within the left anterior descending coronary artery. Cardiomegaly. Bibasilar and dependent atelectasis. No acute findings in the abdomen or pelvis. Left inguinal hernia containing fat. Electronically Signed   By: Rolm Baptise M.D.   On: 11/24/2017 17:28   [place] Ekg Site Rite  Result Date: 11/29/2017 If Site Rite image not attached, placement could not be confirmed due to current cardiac rhythm.  Ct Maxillofacial Wo Contrast  Result Date: 11/24/2017 CLINICAL DATA:  Intracranial abscess. EXAM: CT MAXILLOFACIAL WITHOUT CONTRAST TECHNIQUE: Multidetector CT imaging of the maxillofacial structures was performed. Multiplanar CT image reconstructions were also generated. COMPARISON:  11/04/2017 FINDINGS: Osseous: No acute finding.  Poor dentition. Orbits: Recent subperiosteal abscess drainage in the superior left orbit. Trace fluid was seen on preceding enhanced head CT. Left proptosis has significantly improved and is now mild. Sinuses: Interval left maxillary antrostomy and ethmoidectomies with opening of the frontal ethmoidal recess. Mucosal thickening still covers the left frontal sinus outflow. The antrostomy is widely patent, as is the middle meatus. The lamina papyracea appears to be intact. There is a area of imperceptibly thin fovea ethmoidalis measuring 3 mm in diameter at the level of the Crista galli. Anterior nasal septum perforation with adjacent edematous mucosa and debris. There is history of cocaine use. Small left maxillary sinus fluid level. Soft tissues: Intubation. Limited intracranial: Intracranial subdural  collections and vasogenic edema, known from preceding head CT. IMPRESSION: 1. Interval endoscopic sinus surgery with widely patent left maxillary antrostomy and left ethmoidectomies. Mucosal thickening continues to obstruct left frontal sinus outflow. 2. Interval drainage of left superior orbital abscess. Left proptosis has regressed and is now mild. No residual drainable fluid by preceding enhanced head CT. 3. 3 mm diameter area of imperceptibly thin fovea ethmoidalis on the left. Electronically Signed   By: Monte Fantasia M.D.   On: 11/24/2017 19:00       Subjective: No chest pain or sob.   Discharge Exam: Vitals:   12/02/17 0418 12/02/17 0419  BP: 133/82   Pulse: 63 64  Resp: 18   Temp: (!) 97.5 F (36.4 C)   SpO2: 97% 99%   Vitals:   12/01/17 2029 12/01/17 2349 12/02/17 0418 12/02/17 0419  BP: 137/82 126/73 133/82   Pulse: 74 70 63 64  Resp: '19 19 18   '$ Temp: (!) 97.4 F (36.3 C) 98 F (36.7 C) (!) 97.5 F (36.4 C)   TempSrc: Oral Oral Oral   SpO2:  96% 98% 97% 99%  Weight:      Height:        General: Pt is alert, awake, not in acute distress Cardiovascular: RRR, S1/S2 +, no rubs, no gallops Respiratory: CTA bilaterally, no wheezing, no rhonchi Abdominal: Soft, NT, ND, bowel sounds + Extremities: no edema, no cyanosis    The results of significant diagnostics from this hospitalization (including imaging, microbiology, ancillary and laboratory) are listed below for reference.     Microbiology: Recent Results (from the past 240 hour(s))  Culture, blood (routine x 2)     Status: None   Collection Time: 11/24/17  7:15 PM  Result Value Ref Range Status   Specimen Description BLOOD BLOOD RIGHT HAND  Final   Special Requests   Final    Blood Culture adequate volume BOTTLES DRAWN AEROBIC ONLY   Culture   Final    NO GROWTH 5 DAYS Performed at [facility], [address], [place], [place] [address]    Report Status 11/29/2017 FINAL  Final  Culture, blood  (routine x 2)     Status: None   Collection Time: 11/24/17  7:15 PM  Result Value Ref Range Status   Specimen Description BLOOD BLOOD RIGHT HAND  Final   Special Requests   Final    BOTTLES DRAWN AEROBIC ONLY Blood Culture adequate volume   Culture   Final    NO GROWTH 5 DAYS Performed at [facility], [place] [address] [place], [place] [address]    Report Status 11/29/2017 FINAL  Final  MRSA PCR Screening     Status: None   Collection Time: 11/24/17  7:51 PM  Result Value Ref Range Status   MRSA by PCR NEGATIVE NEGATIVE Final    Comment:        The GeneXpert MRSA Assay (FDA approved for NASAL specimens only), is one component of a comprehensive MRSA colonization surveillance program. It is not intended to diagnose MRSA infection nor to guide or monitor treatment for MRSA infections. Performed at [facility], [place] [address] [place], [place] [address]   Aerobic/Anaerobic Culture (surgical/deep wound)     Status: None   Collection Time: 11/25/17  2:02 AM  Result Value Ref Range Status   Specimen Description ABSCESS  Final   Special Requests BRAIN ID A  Final   Gram Stain NO WBC SEEN NO ORGANISMS SEEN   Final   Culture   Final    NO ANAEROBES ISOLATED Performed at [facility], [address] [place], [place] [address]    Report Status 11/30/2017 FINAL  Final  Aerobic/Anaerobic Culture (surgical/deep wound)     Status: None   Collection Time: 11/25/17  2:46 AM  Result Value Ref Range Status   Specimen Description ABSCESS  Final   Special Requests BRAIN  ID B  Final   Gram Stain   Final    RARE WBC PRESENT, PREDOMINANTLY PMN NO ORGANISMS SEEN    Culture   Final    NO ANAEROBES ISOLATED Performed at [facility], [place] [address], [place], [place] [address]    Report Status 11/30/2017 FINAL  Final  Urine culture     Status: None   Collection Time: 11/25/17  6:01 AM  Result Value Ref Range Status   Specimen Description URINE, CATHETERIZED   Final   Special Requests NONE  Final   Culture   Final    NO GROWTH Performed at [facility], [address] [place], [place] [address]    Report Status 11/26/2017 FINAL  Final  Culture, respiratory (tracheal aspirate)     Status: None   Collection Time: 11/25/17  8:27 AM  Result Value Ref Range Status   Specimen Description TRACHEAL ASPIRATE  Final   Special Requests NONE  Final   Gram Stain   Final    RARE WBC PRESENT,BOTH PMN AND MONONUCLEAR FEW GRAM POSITIVE COCCI IN CLUSTERS    Culture   Final    FEW Consistent with normal respiratory flora. Performed at [facility], [place] [address], [place], [place] [address]    Report Status 11/27/2017 FINAL  Final     Labs: BNP (last 3 results) No results for input(s): BNP in the last 8760 hours. Basic Metabolic Panel: Recent Labs  Lab 11/26/17 0405  11/28/17 0430 11/29/17 0402 11/30/17 0437 12/01/17 0506 12/02/17 0616  NA 141   < > 143 142 141 140 141  K 4.0   < > 3.2* 3.0* 2.9* 2.9* 3.2*  CL 107   < > 107 109 107 106 104  CO2 26   < > 32 '25 26 25 25  '$ GLUCOSE 114*   < > 98 87 97 91 99  BUN 5*   < > 7 <5* 7 5* 6  CREATININE 0.75   < > 0.54* 0.55* 0.58* 0.55* 0.59*  CALCIUM 8.0*   < > 7.8* 8.0* 8.3* 8.3* 8.9  MG 2.3  --  2.2  --  2.0  --   --   PHOS 2.3*  --  2.7  --  2.4*  --   --    < > = values in this interval not displayed.   Liver Function Tests: Recent Labs  Lab 11/28/17 0430  AST 15  ALT 12  ALKPHOS 46  BILITOT 0.4  PROT 5.1*  ALBUMIN 2.2*   No results for input(s): LIPASE, AMYLASE in the last 168 hours. Recent Labs  Lab 11/28/17 1406  AMMONIA 50*   CBC: Recent Labs  Lab 11/28/17 0430 11/28/17 1048 11/29/17 0402 11/30/17 0437 12/02/17 0616  WBC 6.0 7.9 7.4 6.0 8.2  HGB 7.9* 8.5* 8.3* 9.0* 10.9*  HCT 27.1* 27.4* 27.1* 28.3* 33.7*  MCV 93.1 91.9 89.1 87.6 87.3  PLT 203 214 241 287 358   Cardiac Enzymes: No results for input(s): CKTOTAL, CKMB, CKMBINDEX, TROPONINI in the last 168  hours. BNP: Invalid input(s): POCBNP CBG: Recent Labs  Lab 12/01/17 1129 12/01/17 1650 12/01/17 2123 12/02/17 0633 12/02/17 1155  GLUCAP 86 86 84 100* 89   D-Dimer No results for input(s): DDIMER in the last 72 hours. Hgb A1c No results for input(s): HGBA1C in the last 72 hours. Lipid Profile No results for input(s): CHOL, HDL, LDLCALC, TRIG, CHOLHDL, LDLDIRECT in the last 72 hours. Thyroid function studies No results for input(s): TSH, T4TOTAL, T3FREE, THYROIDAB in the last 72 hours.  Invalid input(s): FREET3 Anemia work up No results for input(s): VITAMINB12, FOLATE, FERRITIN, TIBC, IRON, RETICCTPCT in the last 72 hours. Urinalysis    Component Value Date/Time   COLORURINE YELLOW 11/24/2017 1612   APPEARANCEUR HAZY (A) 11/24/2017 1612   LABSPEC 1.012 11/24/2017 1612   PHURINE 5.0 11/24/2017 1612   GLUCOSEU >=500 (A) 11/24/2017 1612   HGBUR MODERATE (A) 11/24/2017 1612   BILIRUBINUR NEGATIVE 11/24/2017 1612   KETONESUR NEGATIVE 11/24/2017 1612   PROTEINUR 100 (A) 11/24/2017 1612   NITRITE NEGATIVE 11/24/2017 1612   LEUKOCYTESUR NEGATIVE 11/24/2017  23   Sepsis Labs Invalid input(s): PROCALCITONIN,  WBC,  LACTICIDVEN Microbiology Recent Results (from the past 240 hour(s))  Culture, blood (routine x 2)     Status: None   Collection Time: 11/24/17  7:15 PM  Result Value Ref Range Status   Specimen Description BLOOD BLOOD RIGHT HAND  Final   Special Requests   Final    Blood Culture adequate volume BOTTLES DRAWN AEROBIC ONLY   Culture   Final    NO GROWTH 5 DAYS Performed at [facility], [place] [address] [place], [place] [address]    Report Status 11/29/2017 FINAL  Final  Culture, blood (routine x 2)     Status: None   Collection Time: 11/24/17  7:15 PM  Result Value Ref Range Status   Specimen Description BLOOD BLOOD RIGHT HAND  Final   Special Requests   Final    BOTTLES DRAWN AEROBIC ONLY Blood Culture adequate volume   Culture   Final    NO GROWTH  5 DAYS Performed at [facility], [place] [address] [place], [place] [address]    Report Status 11/29/2017 FINAL  Final  MRSA PCR Screening     Status: None   Collection Time: 11/24/17  7:51 PM  Result Value Ref Range Status   MRSA by PCR NEGATIVE NEGATIVE Final    Comment:        The GeneXpert MRSA Assay (FDA approved for NASAL specimens only), is one component of a comprehensive MRSA colonization surveillance program. It is not intended to diagnose MRSA infection nor to guide or monitor treatment for MRSA infections. Performed at [facility], [place] [address] [place], [place] [address]   Aerobic/Anaerobic Culture (surgical/deep wound)     Status: None   Collection Time: 11/25/17  2:02 AM  Result Value Ref Range Status   Specimen Description ABSCESS  Final   Special Requests BRAIN ID A  Final   Gram Stain NO WBC SEEN NO ORGANISMS SEEN   Final   Culture   Final    NO ANAEROBES ISOLATED Performed at [facility], [address] [place], [place] [address]    Report Status 11/30/2017 FINAL  Final  Aerobic/Anaerobic Culture (surgical/deep wound)     Status: None   Collection Time: 11/25/17  2:46 AM  Result Value Ref Range Status   Specimen Description ABSCESS  Final   Special Requests BRAIN  ID B  Final   Gram Stain   Final    RARE WBC PRESENT, PREDOMINANTLY PMN NO ORGANISMS SEEN    Culture   Final    NO ANAEROBES ISOLATED Performed at [facility], [place] [address] [place], [place] [address]    Report Status 11/30/2017 FINAL  Final  Urine culture     Status: None   Collection Time: 11/25/17  6:01 AM  Result Value Ref Range Status   Specimen Description URINE, CATHETERIZED  Final   Special Requests NONE  Final   Culture   Final    NO GROWTH Performed at [facility], [place] [address], [place],  [address]    Report Status 11/26/2017 FINAL  Final  Culture, respiratory (tracheal aspirate)     Status: None   Collection Time: 11/25/17   8:27 AM  Result Value Ref Range Status   Specimen Description TRACHEAL ASPIRATE  Final   Special Requests NONE  Final   Gram Stain   Final    RARE WBC  PRESENT,BOTH PMN AND MONONUCLEAR FEW GRAM POSITIVE COCCI IN CLUSTERS    Culture   Final    FEW Consistent with normal respiratory flora. Performed at [facility], [place] [address], [place], [place] [address]    Report Status 11/27/2017 FINAL  Final     Time coordinating discharge: 32 minutes  SIGNED:   Hosie Poisson, MD  [facility] 12/02/2017, 4:06 PM Pager   If 7PM-7AM, please contact night-coverage [email or web address] Password [email or web address]

## 2017-12-02 NOTE — Progress Notes (Signed)
Pharmacy Antibiotic Note  Ryan Bryan is a 42 y.o. male admitted on 11/24/2017 with subdural empyema.  Pharmacy has been consulted for Vancomycin, Ceftriaxone, and Metronidazole dosing. Now planning to discharge to home.  He remains on Vancomycin 1250mg  IV q6h with a trough that has been subtherapeutic. This regimen is not optimal for home administration. Discussed via phone with Dr. Drue Second today and the ID team will work on changing his Vancomycin to an alternative agent on Monday 10/28.  He does not have insurance coverage so we will need to pursue patient assistance for his antibiotic therapy.  Plan: ID team to determine an alternative to Vancomycin on Monday 10/28 - likely a switch to Linezolid 600mg  PO BID  Height: 6\' 1"  (185.4 cm) Weight: 266 lb 1.5 oz (120.7 kg) IBW/kg (Calculated) : 79.9  Temp (24hrs), Avg:98.1 F (36.7 C), Min:97.5 F (36.4 C), Max:98.6 F (37 C)  Recent Labs  Lab 11/28/17 0430 11/28/17 1048 11/29/17 0402 11/29/17 0846 11/30/17 0437 11/30/17 1055 12/01/17 0506 12/02/17 0616  WBC 6.0 7.9 7.4  --  6.0  --   --  8.2  CREATININE 0.54*  --  0.55*  --  0.58*  --  0.55* 0.59*  VANCOTROUGH  --   --   --  5*  --  8*  --   --     Estimated Creatinine Clearance: 163.7 mL/min (A) (by C-G formula based on SCr of 0.59 mg/dL (L)).    No Known Allergies    Thank you for allowing pharmacy to be a part of this patient's care.  Estella Husk, Pharm.D., BCPS, BCIDP Clinical Pharmacist Phone: [phone] Please check AMION for all [facility] numbers 12/02/2017, 9:04 PM

## 2017-12-02 NOTE — Care Management Note (Addendum)
Case Management Note  Patient Details  Name: Ryan Bryan MRN: [id number] Date of Birth: May 08, 1975  Subjective/Objective:         Pt presents with seizure, subdural empyema and craniotomy.  Pt is no longer appropriate for CIR as he is mobilizing well.  Pt has refused SNF and wants to go home to sister's house with [facility] services.  Pt advised MD he is insisting to go home today due to family availability and transportation issues.  Pt has history of illicit drug use, is uninsured and has no PCP.   Action/Plan: Pt needs 4 weeks of antibiotics including Vancomycin Q6h and Ceftriaxone Q12 hours.  Pt will need to qualify for charity home health services through [facility].  Also, patient has had no education to self-administer IV antibiotics.  Since his doses are so frequent, he will need education done by the home infusion nurse while in the hospital, and is not available on the weekend. D/W Jerm[facility] of [facility].  Advised patient and MD that it is not possible for him to dc today.  Patient states he will arrange with family to pick him up tomorrow. Patient plans to stay at his sister's home, which is [address] [place], [place] [address].   Pt had a primary MD that he would like to re-establish care with as it has been 8-10 years since he has seen him. Dr. Marina Goodell at [facility] is patient's previous PCP.  Only NPs are listed on this website.  May need to explore other options.   Expected Discharge Date:  12/03/17               Expected Discharge Plan:  Home w Home Health Services  In-House Referral:  NA  Discharge planning Services  CM Consult  Post Acute Care Choice:  Home Health, Durable Medical Equipment Choice offered to:  Patient  DME Arranged:  IV pump/equipment DME Agency:  [facility]  HH Arranged:  IV Antibiotics, RN, PT, OT HH Agency:  [facility]  Status of Service:  In process, will continue to follow  If discussed at Long Length of  Stay Meetings, dates discussed:    Additional Comments:  Deveron Furlong, RN 12/02/2017, 12:19 PM

## 2017-12-03 ENCOUNTER — Telehealth (HOSPITAL_COMMUNITY): Payer: Self-pay | Admitting: Pharmacist

## 2017-12-03 LAB — GLUCOSE, CAPILLARY
GLUCOSE-CAPILLARY: 90 mg/dL (ref 70–99)
GLUCOSE-CAPILLARY: 108 mg/dL — AB (ref 70–99)
Glucose-Capillary: 93 mg/dL (ref 70–99)
Glucose-Capillary: 99 mg/dL (ref 70–99)

## 2017-12-03 LAB — POTASSIUM: POTASSIUM: 3.9 mmol/L (ref 3.5–5.1)

## 2017-12-03 LAB — VANCOMYCIN, TROUGH: Vancomycin Tr: 14 ug/mL — ABNORMAL LOW (ref 15–20)

## 2017-12-03 MED ORDER — LINEZOLID 600 MG PO TABS
600.0000 mg | ORAL_TABLET | Freq: Two times a day (BID) | ORAL | Status: DC
  Administered 2017-12-03: 600 mg via ORAL
  Filled 2017-12-03: qty 1

## 2017-12-03 MED ORDER — LEVETIRACETAM 750 MG PO TABS: 1500.0000 mg | ORAL_TABLET | Freq: Two times a day (BID) | ORAL | 0 refills | Status: DC

## 2017-12-03 MED ORDER — HEPARIN SOD (PORK) LOCK FLUSH 100 UNIT/ML IV SOLN
250.0000 [IU] | INTRAVENOUS | Status: AC | PRN
  Administered 2017-12-03: 250 [IU]

## 2017-12-03 MED ORDER — METRONIDAZOLE 500 MG PO TABS: 500.0000 mg | ORAL_TABLET | Freq: Three times a day (TID) | ORAL | 0 refills | Status: AC

## 2017-12-03 MED ORDER — LINEZOLID 600 MG PO TABS: 600.0000 mg | ORAL_TABLET | Freq: Two times a day (BID) | ORAL | 0 refills | Status: AC

## 2017-12-03 MED ORDER — DIVALPROEX SODIUM 250 MG PO DR TAB: 800.0000 mg | DELAYED_RELEASE_TABLET | Freq: Three times a day (TID) | ORAL | 0 refills | Status: DC

## 2017-12-03 MED ORDER — LINEZOLID 600 MG PO TABS: 600.0000 mg | ORAL_TABLET | Freq: Two times a day (BID) | ORAL | 0 refills | Status: DC

## 2017-12-03 NOTE — Progress Notes (Signed)
Patient approved for 30 days of Zyvox through [employer]. This medication will be mailed to patient's sister's home tomorrow. Patient is aware.   Sharin Mons, PharmD, BCPS  Infectious Diseases Clinical Pharmacist Phone: [phone]

## 2017-12-03 NOTE — Progress Notes (Signed)
Patient discharged in stable condition with all belongings. He verbalized understanding of all discharge instructions and importance of follow up visits.  

## 2017-12-03 NOTE — Progress Notes (Signed)
  Speech Language Pathology Treatment: Cognitive-Linquistic  Patient Details Name: Ryan BUNTON MRN: [id number] DOB: 11-23-1975 Today's Date: 12/03/2017 Time: 5621-3086 SLP Time Calculation (min) (ACUTE ONLY): 22 min  Assessment / Plan / Recommendation Clinical Impression  Pt with improved alternating attention during tasks requiring written record-keeping, completing independently.  Working [employer] fast streamer and prospective recall are [facility].  Pt continues to require verbal cues to self-monitor and problem-solve during calculation tasks and identify balance after subtracting multiple numbers. The plan is for OP OT and PT.  Recommend continued work towards higher-level cognitive re-training in the context of OT.  SLP will s/o. Pt for D/C home with sister today.   HPI HPI: 42 year old with history of prior cocaine use, cardiac stent, recent eye surgery, sinus surgery for subperiorbital cellulitis at [facility] admitted with status epilepticus secondary to subdural empyema. 10/5- Admit to [facility] for left subperiorbital abscess. S/p FESS and orbitotomy by ENT and opthal. 10/19- Admit to [facility] with status epilepticus, secondary to cerebritis and bilateral frontal empyema. He is now status post emergent bifrontal craniotomy with evacuation of abscess and subdural empyema with skull base reconstruction.   intubated in ED 10/19. Extubated on 10/23.       SLP Plan  Discharge SLP treatment due to (comment)(D/C from hospital)   Continue 24 hour supervision initially.    Recommendations                   Follow up Recommendations: None SLP Visit Diagnosis: Cognitive communication deficit (R41.841) Attention and concentration deficit following: Other cerebrovascular disease Plan: Discharge SLP treatment due to (comment)(D/C from hospital)       GO                Blenda Mounts Laurice 12/03/2017, 11:25 AM   Marchelle Folks L. Samson Frederic, MA CCC/SLP Acute Rehabilitation Services Office number [phone] Pager  [phone]

## 2017-12-03 NOTE — Progress Notes (Signed)
PT Cancellation Note  Patient Details Name: Ryan Bryan MRN: [id number] DOB: [birth year]   Cancelled Treatment:    Reason Eval/Treat Not Completed: (P) Other (comment)(Pt ambulating in halls on arrival, reports he is eager to return home and refused PT this pm to focus on his d/c home today.  )   Florestine Avers 12/03/2017, 1:40 PM  Joycelyn Rua, PTA Acute Rehabilitation Services Pager [phone] Office [phone]

## 2017-12-03 NOTE — Progress Notes (Signed)
Occupational Therapy Treatment Patient Details Name: Ryan Bryan MRN: [id number] DOB: [birth year] Today's Date: 12/03/2017    History of present illness 42 year old with history of prior cocaine use, cardiac stent, recent eye surgery, sinus surgery for subperiorbital cellulitis at [facility] (sinus surgery s/p birfrontal craniotomy and drainage). Admitted with status epilepticus secondary to subdural empyema. 42 year old with history of prior cocaine use, cardiac stent, recent eye surgery, sinus surgery for subperiorbital cellulitis at [facility] admitted with status epilepticus secondary to subdural empyema.  Intubated 10/19. Extubated 10/23.    OT comments  Patient progressing well.  Eager to [employer] home and reports plan to dc with sister who will be available 24/7.  Patient completed medi-cog assessment with errors only on medication mgmt (new task for patient) and required significant assistance to correct; reviewed recommendation for use of pill box and having his sister assist with task, pt agreeable.  Demonstrates improving problem solving and memory, requires min cueing for path finding task (cueing to utilize signs, in order to find destination) but able to recall 3 step activity while engaging in conversation without cueing. Continue to recommend outpatient OT for high level cognitive re-training.  Will continue to follow while admitted.    Follow Up Recommendations  Outpatient OT;Supervision/Assistance - 24 hour    Equipment Recommendations  3 in 1 bedside commode    Recommendations for Other Services      Precautions / Restrictions Precautions Precautions: Fall Restrictions Weight Bearing Restrictions: No       Mobility Bed Mobility               General bed mobility comments: seated in recliner upon entry  Transfers Overall transfer level: Modified independent Equipment used: None                  Balance Overall balance assessment: Needs assistance   Sitting  balance-Leahy Scale: Normal     Standing balance support: No upper extremity supported;During functional activity Standing balance-Leahy Scale: Fair                             ADL either performed or assessed with clinical judgement   ADL Overall ADL's : Needs assistance/impaired     Grooming: Wash/dry hands;Standing;Modified independent                   Toilet Transfer: Ambulation;Modified Independent(simulated in room)           Functional mobility during ADLs: Supervision/safety General ADL Comments: Pt progressing well! Complete 3 step task (pathfinding, retrieval of item, and return to room to complete grooming) with min cueing during pathfinding and no cueing to recall tasks.  Medication management with assist, reviewing with patient.       Vision   Additional Comments: vision appears [facility] for visual scanning in enviornment and med cog clock drawing portion, possible decreased visual attention with med mgmt portion    Perception     Praxis      Cognition Arousal/Alertness: Awake/alert Behavior During Therapy: WFL for tasks assessed/performed Overall Cognitive Status: Impaired/Different from baseline Area of Impairment: Problem solving;Awareness                           Awareness: Emergent;Anticipatory Problem Solving: Requires verbal cues General Comments: medi cog completed: 6/10 with all errors on medication management piece.        Exercises     Shoulder Instructions  General Comments      Pertinent Vitals/ Pain       Pain Assessment: No/denies pain  Home Living                                          Prior Functioning/Environment              Frequency  Min 3X/week        Progress Toward Goals  OT Goals(current goals can now be found in the care plan section)  Progress towards OT goals: Progressing toward goals  Acute Rehab OT Goals Patient Stated Goal: to get better OT  Goal Formulation: With patient Time For Goal Achievement: 12/13/17 Potential to Achieve Goals: Good  Plan Discharge plan remains appropriate;Frequency remains appropriate    Co-evaluation                 AM-PAC PT "6 Clicks" Daily Activity     Outcome Measure   Help from another person eating meals?: None Help from another person taking care of personal grooming?: None Help from another person toileting, which includes using toliet, bedpan, or urinal?: None Help from another person bathing (including washing, rinsing, drying)?: None Help from another person to put on and taking off regular upper body clothing?: None Help from another person to put on and taking off regular lower body clothing?: None 6 Click Score: 24    End of Session    OT Visit Diagnosis: Other abnormalities of gait and mobility (R26.89);Muscle weakness (generalized) (M62.81);Other symptoms and signs involving cognitive function   Activity Tolerance Patient tolerated treatment well   Patient Left in chair;with call bell/phone within reach   Nurse Communication Mobility status        Time: 1610-9604 OT Time Calculation (min): 21 min  Charges: OT General Charges $OT Visit: 1 Visit OT Treatments $Self Care/Home Management : 8-22 mins  Chancy Milroy, OT Acute Rehabilitation Services Pager [phone] Office [phone]    Chancy Milroy 12/03/2017, 8:37 AM

## 2017-12-03 NOTE — Progress Notes (Signed)
Pharmacy Antibiotic Note  Ryan Bryan is a 42 y.o. male admitted on 11/24/2017 with subdural empyema.  Pharmacy has been consulted for Vancomycin, Ceftriaxone, and Metronidazole dosing. Now planning to discharge to home.  He remains on Vancomycin 1250mg  IV q6h with a trough that has been subtherapeutic. Trough this morning resulted at 14 which is still slightly subtherapeutic. This vancomycin regimen is not ideal for home administration and puts the patient at high risk for kidney injury. Per Discussion with Dr. Drue Second on 12/02/17 PM, we will switch the patient to PO linezolid today and obtain patient assistance.   Plan: Per ID team, switch Vancomycin to Linezolid 600 mg PO BID Pharmacy team will plan to obtain patient assistance through [employer] and have medication delivered to patient home [facility] aware of change in antibiotic therapy   Height: 6\' 1"  (185.4 cm) Weight: 257 lb 8 oz (116.8 kg) IBW/kg (Calculated) : 79.9  Temp (24hrs), Avg:98.2 F (36.8 C), Min:97.8 F (36.6 C), Max:98.6 F (37 C)  Recent Labs  Lab 11/28/17 0430 11/28/17 1048 11/29/17 0402  11/30/17 0437 11/30/17 1055 12/01/17 0506 12/02/17 0616 12/03/17 0759  WBC 6.0 7.9 7.4  --  6.0  --   --  8.2  --   CREATININE 0.54*  --  0.55*  --  0.58*  --  0.55* 0.59*  --   VANCOTROUGH  --   --   --    < >  --  8*  --   --  14*   < > = values in this interval not displayed.    Estimated Creatinine Clearance: 161.1 mL/min (A) (by C-G formula based on SCr of 0.59 mg/dL (L)).    No Known Allergies    Thank you for allowing pharmacy to be a part of this patient's care.  Sharin Mons, PharmD, BCPS Infectious Diseases Clinical Pharmacist Phone: [phone] Please check AMION for all [facility] numbers 12/03/2017, 8:58 AM

## 2017-12-03 NOTE — Discharge Summary (Signed)
Physician Discharge Summary  Ryan Bryan QIW:[id number] DOB: 1975-03-14 DOA: 11/24/2017  PCP: Patient, No Pcp Per  Admit date: 11/24/2017 Discharge date: 12/03/2017  Admitted From: Home.  Disposition: Home.   Recommendations for Outpatient Follow-up:  1. Follow up with PCP in 1-2 weeks 2. Please obtain BMP/CBC in one week Please follow up with neurosurgery Dr Zada Finders in 2 weeks. Please followup with neurology as recommended in 4 weeks.  Please follow up with ID as scheduled.  Please follow up with Home health PT,OT RN.   Discharge Condition:stable.  CODE STATUS: full code.  Diet recommendation: Heart Healthy   Brief/Interim Summary: 42 year old with history of prior cocaine use, cardiac stent, recent eye surgery, sinus surgery for subperiorbital cellulitis at [facility] admitted with status epilepticus secondary to subdural empyema    Events: 10/2- 10/5- Admit to [facility] for left subperiorbital abscess. S/p FESS and orbitotomy by ENT and opthal. Notes in care everywhere 10/19- Admit to [facility] with status epilepticus, intubated in ED 10/21: Still with seizure overnight, Versed bolus and drip reinitiated, continuing full ventilator support until seizure activity subsides, still awaiting culture data 10/22: Starting to wean Versed, no obvious seizure activity currently 10/23: Following commands. Pressure support volumes excellent, antibiotics continued. Sedating medications discontinued. Plan to extubate once mental status allows and cuff leak present 10/25 : transfer the patient to [facility].    Discharge Diagnoses:  Active Problems:   Status epilepticus ([place])   Subdural empyema  Sub dural  Empyema s/p recent eye and sinus surgery.  Underwent bifrontal craniotomy and drainage. Resume IV antibiotics as per infectious disease, will be on zyvox, ceftriaxone and flagyl till 11/30.   And follow up with neuro surgery and infectious as recommended.  Pt is alert and denies any complaints  at this time.    Seizures  Changed all IV antiepileptic medications to oral.   Acute respiratory failure sec to seizures. S/p intubation on 10/19, extubated on 10/23.  He is currently on RA, without any distress  Recommend IS and flutter valve getting out of bed into the chair and ambulate as per physical therapy.    Anemia of critical illness.  Transfuse to keep hemoglobin greater than 8.  Hemoglobin is around 9   Hypokalemia: replaced. Mag level wnl.    Discharge Instructions  Discharge Instructions    Diet - low sodium heart healthy   Complete by:  As directed    Diet - low sodium heart healthy   Complete by:  As directed    Diet - low sodium heart healthy   Complete by:  As directed    Discharge instructions   Complete by:  As directed    Please follow up with neuro surgery and Infectious disease  as recommended   Discharge instructions   Complete by:  As directed    Please follow up with neurosurgery and Infectious disease as recommended.   Home infusion instructions [facility] May follow [place] Dosing Protocol; May administer Cathflo as needed to maintain patency of vascular access device.; Flushing of vascular access device: per [facility] Protocol: 0.9% NaCl pre/post medica...   Complete by:  As directed    Instructions:  May follow [place] Dosing Protocol   Instructions:  May administer Cathflo as needed to maintain patency of vascular access device.   Instructions:  Flushing of vascular access device: per [facility] Protocol: 0.9% NaCl pre/post medication administration and prn patency; Heparin 100 u/ml, 53m for implanted ports and Heparin 10u/ml, 565mfor all  other central venous catheters.   Instructions:  May follow [facility] Anaphylaxis Protocol for First Dose Administration in the home: 0.9% NaCl at 25-50 ml/hr to maintain IV access for protocol meds. Epinephrine 0.3 ml IV/IM PRN and Benadryl 25-50 IV/IM PRN s/s of anaphylaxis.   Instructions:  [place] Infusion Coordinator (RN) to assist per patient IV care needs in the home PRN.   Home infusion instructions [facility] May follow [place] Dosing Protocol; May administer Cathflo as needed to maintain patency of vascular access device.; Flushing of vascular access device: per [facility] Protocol: 0.9% NaCl pre/post medica...   Complete by:  As directed    Instructions:  May follow [place] Dosing Protocol   Instructions:  May administer Cathflo as needed to maintain patency of vascular access device.   Instructions:  Flushing of vascular access device: per [facility] Protocol: 0.9% NaCl pre/post medication administration and prn patency; Heparin 100 u/ml, 30m for implanted ports and Heparin 10u/ml, 551mfor all other central venous catheters.   Instructions:  May follow [facility] Anaphylaxis Protocol for First Dose Administration in the home: 0.9% NaCl at 25-50 ml/hr to maintain IV access for protocol meds. Epinephrine 0.3 ml IV/IM PRN and Benadryl 25-50 IV/IM PRN s/s of anaphylaxis.   Instructions:  [place] Coordinator (RN) to assist per patient IV care needs in the home PRN.   Increase activity slowly   Complete by:  As directed      Allergies as of 12/03/2017   No Known Allergies     Medication List    STOP taking these medications   ibuprofen 200 MG tablet Commonly known as:  ADVIL,MOTRIN     TAKE these medications   cefTRIAXone  IVPB Commonly known as:  ROCEPHIN Inject 2 g into the vein every 12 (twelve) hours. Indication:  Subdural empyema / status epilepticus Last Day of Therapy:  01/05/18 Labs - Once weekly:  CBC/D and BMP, Labs - Every other week:  ESR and CRP   divalproex 250 MG DR tablet Commonly known as:  DEPAKOTE Take 3 tablets (750 mg total) by mouth every 8 (eight) hours.   Lacosamide 100 MG Tabs Take 1 tablet (100 mg total) by mouth 2 (two) times daily.   levETIRAcetam 750 MG tablet Commonly known as:  KEPPRA Take 2 tablets (1,500 mg  total) by mouth 2 (two) times daily.   linezolid 600 MG tablet Commonly known as:  ZYVOX Take 1 tablet (600 mg total) by mouth every 12 (twelve) hours.   metroNIDAZOLE 500 MG tablet Commonly known as:  FLAGYL Take 1 tablet (500 mg total) by mouth every 8 (eight) hours.            Home Infusion Instuctions  (From admission, onward)         Start     Ordered   12/02/17 0000  Home infusion instructions [facility] May follow [facility] Protocol; May administer Cathflo as needed to maintain patency of vascular access device.; Fl[place] of vascular access device: [facility]: 0.9% NaCl pre/post medica...    Question Answer Comment  Instructions May follow [facility] Protocol   Instructions May administer Cathflo as needed to maintain patency of vascular access device.   Instructions Flushing of vascular access device: per [facility]: 0.9% NaCl pre/post medication administration and prn patency; Heparin 100 u/ml, 57m22mor implanted ports and Heparin 10u/ml, 57ml61mr all other central venous catheters.   Instructions May follow  [facility] Anaphylaxis Protocol for First Dose Administration in the home: 0.9% NaCl at 25-50 ml/hr to maintain IV access for protocol meds. Epinephrine 0.3 ml IV/IM PRN and Benadryl 25-50 IV/IM PRN s/s of anaphylaxis.   Instructions [facility] Infusion Coordinator (RN) to assist per patient IV care needs in the home PRN.      12/02/17 1109   12/02/17 0000  Home infusion instructions [facility] May follow [place] Dosing Protocol; May administer Cathflo as needed to maintain patency of vascular access device.; Flushing of vascular access device: per [facility] Protocol: 0.9% NaCl pre/post medica...    Question Answer Comment  Instructions May follow [place] Dosing Protocol   Instructions May administer Cathflo as needed to maintain patency of vascular access device.   Instructions Flushing of vascular access device: per [facility]  Protocol: 0.9% NaCl pre/post medication administration and prn patency; Heparin 100 u/ml, 91m for implanted ports and Heparin 10u/ml, 539mfor all other central venous catheters.   Instructions May follow [facility] Anaphylaxis Protocol for First Dose Administration in the home: 0.9% NaCl at 25-50 ml/hr to maintain IV access for protocol meds. Epinephrine 0.3 ml IV/IM PRN and Benadryl 25-50 IV/IM PRN s/s of anaphylaxis.   Instructions [facility] Infusion Coordinator (RN) to assist per patient IV care needs in the home PRN.      12/02/17 1130         Follow-up Information    CaGolden CircleFNP Follow up.   Specialties:  Family Medicine, Infectious Diseases Why:  11/26 @ 9:30 am. If you are not able to make this appointment, please contact our office to reschdule.  Contact information: [phone]        OsJudith PartMD. Schedule an appointment as soon as possible for a visit in 2 week(s).   Specialty:  Neurosurgery Contact information: [phone]      [facility] Follow up in 1 month(s).   Contact information: [address]   [place] [phone]       No Known Allergies  Consultations: Neurology 10/19- signed off on 10/24 Neurosurgery 10/19- signed off 10/24 ID [id number]-     Procedures/Studies: Ct Head Wo Contrast  Result Date: 11/25/2017 CLINICAL DATA:  Status post craniotomy with evacuation of abscess and empyema. Frontal sinus cranialization. Skull base defect repair. Evacuation of abscess and empyema. EXAM: CT HEAD WITHOUT CONTRAST TECHNIQUE: Contiguous axial images were obtained from the base of the skull through the vertex without intravenous contrast. COMPARISON:  MRI brain 11/24/2017 FINDINGS: Brain: Bifrontal craniotomy is noted. Bilateral extra-axial collections are evaluated. There is  residual edema in the anterior inferior left frontal lobe. Minimal extra-axial fluid and gas remains. Minimal extra-axial fluid and gas is also present on right, decreased from yesterday. New collections are present. Minimal subfalcine herniation is improved Vascular: No hyperdense vessel or unexpected calcification. Skull: Bifrontal craniotomies are noted. Associated scalp soft tissue swelling is present. Sinuses/Orbits: Left maxillary antrostomy is noted. There is increased fluid in left maxillary sinus. Circumferential mucosal thickening is again seen. Ethmoid mucosal disease is unchanged. Frontal sinuses are cranialized. IMPRESSION: 1. 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 2. Residual edema remains, left greater than right with decreased mass effect and subfalcine herniation. 3. Cranialization of frontal sinuses. Electronically Signed   By: ChHarrell Gave  Mattern M.D.   On: 11/25/2017 12:18   Ct Head W & Wo Contrast  Result Date: 11/24/2017 CLINICAL DATA:  Altered level of consciousness, unexplained EXAM: CT HEAD WITHOUT AND WITH CONTRAST TECHNIQUE: Contiguous axial images were obtained from the base of the skull through the vertex without and with intravenous contrast CONTRAST:  37m ISOVUE-370 IOPAMIDOL (ISOVUE-370) INJECTION 76% COMPARISON:  11/04/2017 FINDINGS: Brain: There are rim enhancing subfrontal collections on both sides and in the midline. On the left the collection measures up to 3 cm in diameter and 14 mm in thickness on the right the collection is 2.7 cm in diameter and 12 mm in thickness. The midline collection is along the posterior planum sphenoidale, 2.7 cm in maximum diameter by 7 mm in thickness. These are consistent with subdural empyemas in this patient with complicated sinusitis seen recently. There is marked edema in the bilateral inferior frontal lobes. No hydrocephalus, shift, or herniation. Vascular: Major vessels are enhancing  Sinuses/Orbits: Anterior nasal septum perforation. Changes of endoscopic sinus surgery with improved drainage. There is still active sinusitis, greatest on the left, with maxillary fluid level and extensive frontal opacification. Significantly improved extraconal abscess in the left superior orbit, with trace fluid density seen on coronal reformats measuring only 1 mm in thickness. Reportedly there was eye surgery last week at outside facility. Other: Critical Value/emergent results were called by telephone at the time of interpretation on 11/24/2017 at 5:25 pm to Dr. WBlanchie Dessert, who verbally acknowledged these results. IMPRESSION: 1. Three separate subdural empyemas in the anterior cranial fossa located bilaterally and in the midline. These collections measure up to 3 cm in diameter and 1.4 cm in thickness. Marked adjacent vasogenic edema. 2. Improved extraconal abscess in the superior left orbit with minimal fluid seen on coronal reformats. 3. Interval endoscopic sinus surgery but still active sinusitis Electronically Signed   By: JMonte FantasiaM.D.   On: 11/24/2017 17:28   Mr Brain W And Wo Contrast  Result Date: 11/24/2017 CLINICAL DATA:  Intracranial complications of sinusitis. EXAM: MRI HEAD WITHOUT AND WITH CONTRAST TECHNIQUE: Multiplanar, multiecho pulse sequences of the brain and surrounding structures were obtained without and with intravenous contrast. CONTRAST:  12 mL Gadavist COMPARISON:  CT sinus 11/24/2017 FINDINGS: BRAIN: There are 3 extra-axial collections of the anterior cranial fossa that show internal diffusion restriction. The right most collection measures 3.1 x 1.1 x 3.4 cm. The midline collection measures 0.8 x 3.0 x 0.7 cm. The left most collection measures 3.1 x 2.6 x 1.4 cm. There is a large amount of surrounding vasogenic edema in the frontal lobes, left-greater-than-right. There is no parenchymal contrast enhancement. Otherwise, the brain parenchymal signal is normal. The  cerebral and cerebellar volume are age-appropriate. Susceptibility-sensitive sequences show no chronic microhemorrhage or superficial siderosis. VASCULAR: Major intracranial arterial and venous sinus flow voids are normal. SKULL AND UPPER CERVICAL SPINE: Calvarial bone marrow signal is normal. There is no skull base mass. Visualized upper cervical spine and soft tissues are normal. SINUSES/ORBITS: Status post left ethmoidectomy and left maxillary antrostomy. Moderate residual left maxillary mucosal thickening. Left frontal recess remains occluded. Unchanged mild left proptosis compared to earlier CT. No residual orbital abscess. IMPRESSION: 1. Three anterior cranial fossa subdural empyemas with surrounding vasogenic edema that is worst in the left frontal lobe. The largest collection, on the left, measures 3.1 x 2.6 x 1.4 cm. 2. No cerebral parenchymal contrast enhancement to indicate cerebritis. 3. Status post left maxillary antrostomy and left ethmoidectomy with drainage  of extraconal left orbital abscess. Mild residual proptosis. Left frontal recess remains occluded. Electronically Signed   By: Ulyses Jarred M.D.   On: 11/24/2017 22:11   Dg Chest Port 1 View  Result Date: 11/28/2017 CLINICAL DATA:  Pneumonia. EXAM: PORTABLE CHEST 1 VIEW COMPARISON:  Radiograph November 26, 2017. FINDINGS: Stable cardiomediastinal silhouette. Endotracheal and nasogastric tubes are unchanged in position. No pneumothorax is noted. Mild bibasilar subsegmental atelectasis is noted with associated pleural effusions. Bony thorax is unremarkable. IMPRESSION: Mild bibasilar subsegmental atelectasis with associated small pleural effusions. Stable support apparatus. Electronically Signed   By: Marijo Conception, M.D.   On: 11/28/2017 10:39   Dg Chest Port 1 View  Result Date: 11/26/2017 CLINICAL DATA:  Hypoxia EXAM: PORTABLE CHEST 1 VIEW COMPARISON:  November 25, 2017 FINDINGS: Endotracheal tube tip is 4.4 cm above the carina.  Nasogastric tube tip and side port are below the diaphragm. No pneumothorax. There is a small left pleural effusion with atelectatic change in the left base. The right lung is clear. Heart is borderline enlarged with pulmonary vascularity normal. No adenopathy. No bone lesions. IMPRESSION: Tube positions as described without pneumothorax. Small left pleural effusion with left base atelectasis. Right lung clear. Stable cardiac prominence. Electronically Signed   By: Lowella Grip III M.D.   On: 11/26/2017 07:10   Dg Chest Port 1 View  Result Date: 11/25/2017 CLINICAL DATA:  42 year old male with a history of brain abscess EXAM: PORTABLE CHEST 1 VIEW COMPARISON:  11/24/2017 FINDINGS: Cardiomediastinal silhouette unchanged in size and contour. Removal of the defibrillator pads. Enteric tube terminates suitably above the carina, approximately 5.4 cm. Gastric tube has been placed in the interval, terminating out of the field of view. Low lung volumes with patchy opacities at the lung bases similar to prior. No pneumothorax or pleural effusion. IMPRESSION: Low lung volumes with likely atelectasis. Endotracheal tube suitably position above the carina. Interval placement of gastric tube. Electronically Signed   By: Corrie Mckusick D.O.   On: 11/25/2017 08:42   Dg Chest Port 1 View  Result Date: 11/24/2017 CLINICAL DATA:  Status post intubation today. Patient unresponsive. Seizures. EXAM: PORTABLE CHEST 1 VIEW COMPARISON:  None. FINDINGS: New endotracheal tube is in place with the tip at the level of the clavicular heads, 6 cm above the carina. Defibrillator pad is noted. Lungs are clear. Heart size is normal. No pneumothorax or pleural fluid. IMPRESSION: ET tube in good position. Lungs clear. Electronically Signed   By: Inge Rise M.D.   On: 11/24/2017 16:10   Dg Abd Portable 1v  Result Date: 11/24/2017 CLINICAL DATA:  OG tube placement EXAM: PORTABLE ABDOMEN - 1 VIEW COMPARISON:  None. FINDINGS: OG  tube coils in the fundus with the tip in the distal stomach. IMPRESSION: OG tube tip in the distal stomach. Electronically Signed   By: Rolm Baptise M.D.   On: 11/24/2017 20:06   Ct Angio Chest/abd/pel For Dissection W And/or Wo Contrast  Result Date: 11/24/2017 CLINICAL DATA:  Unresponsive.  Back pain.  History of hypertension. EXAM: CT ANGIOGRAPHY CHEST, ABDOMEN AND PELVIS TECHNIQUE: Multidetector CT imaging through the chest, abdomen and pelvis was performed using the standard protocol during bolus administration of intravenous contrast. Multiplanar reconstructed images and MIPs were obtained and reviewed to evaluate the vascular anatomy. CONTRAST:  73m ISOVUE-370 IOPAMIDOL (ISOVUE-370) INJECTION 76% COMPARISON:  None. FINDINGS: CTA CHEST FINDINGS Cardiovascular: Heart is mildly enlarged. Aorta is normal caliber. No dissection. No filling defects in the pulmonary arteries to  suggest pulmonary emboli. Coronary artery calcifications in the left anterior descending coronary artery. Mediastinum/Nodes: No mediastinal, hilar, or axillary adenopathy. Lungs/Pleura: Tendon atelectasis in the lower lobes bilaterally. No effusions. Musculoskeletal: No acute bony abnormality. Review of the MIP images confirms the above findings. CTA ABDOMEN AND PELVIS FINDINGS VASCULAR Aorta: Normal caliber. Scattered minimal infrarenal calcifications. No dissection. Celiac: Widely patent SMA: Widely patent Renals: Single bilaterally, widely patent IMA: Widely patent Inflow: Normal caliber, no dissection. Veins: Unremarkable. Review of the MIP images confirms the above findings. NON-VASCULAR Hepatobiliary: No focal hepatic abnormality. Gallbladder unremarkable. Pancreas: No focal abnormality or ductal dilatation. Spleen: No focal abnormality.  Normal size. Adrenals/Urinary Tract: No adrenal abnormality. No focal renal abnormality. No stones or hydronephrosis. Urinary bladder is unremarkable. Stomach/Bowel: Mild gaseous distention of  the stomach. Large and small bowel unremarkable. Lymphatic: No adenopathy. Reproductive: No visible focal abnormality. Other: No free fluid or free air. Moderate-sized left inguinal hernia containing fat. Musculoskeletal: No acute bony abnormality. Degenerative disc disease at L4-5 with disc space narrowing and spurring. Review of the MIP images confirms the above findings. IMPRESSION: No evidence of aortic aneurysm or dissection. Coronary artery disease within the left anterior descending coronary artery. Cardiomegaly. Bibasilar and dependent atelectasis. No acute findings in the abdomen or pelvis. Left inguinal hernia containing fat. Electronically Signed   By: Rolm Baptise M.D.   On: 11/24/2017 17:28   [place] Ekg Site Rite  Result Date: 11/29/2017 If Site Rite image not attached, placement could not be confirmed due to current cardiac rhythm.  Ct Maxillofacial Wo Contrast  Result Date: 11/24/2017 CLINICAL DATA:  Intracranial abscess. EXAM: CT MAXILLOFACIAL WITHOUT CONTRAST TECHNIQUE: Multidetector CT imaging of the maxillofacial structures was performed. Multiplanar CT image reconstructions were also generated. COMPARISON:  11/04/2017 FINDINGS: Osseous: No acute finding.  Poor dentition. Orbits: Recent subperiosteal abscess drainage in the superior left orbit. Trace fluid was seen on preceding enhanced head CT. Left proptosis has significantly improved and is now mild. Sinuses: Interval left maxillary antrostomy and ethmoidectomies with opening of the frontal ethmoidal recess. Mucosal thickening still covers the left frontal sinus outflow. The antrostomy is widely patent, as is the middle meatus. The lamina papyracea appears to be intact. There is a area of imperceptibly thin fovea ethmoidalis measuring 3 mm in diameter at the level of the Crista galli. Anterior nasal septum perforation with adjacent edematous mucosa and debris. There is history of cocaine use. Small left maxillary sinus fluid level. Soft  tissues: Intubation. Limited intracranial: Intracranial subdural collections and vasogenic edema, known from preceding head CT. IMPRESSION: 1. Interval endoscopic sinus surgery with widely patent left maxillary antrostomy and left ethmoidectomies. Mucosal thickening continues to obstruct left frontal sinus outflow. 2. Interval drainage of left superior orbital abscess. Left proptosis has regressed and is now mild. No residual drainable fluid by preceding enhanced head CT. 3. 3 mm diameter area of imperceptibly thin fovea ethmoidalis on the left. Electronically Signed   By: Monte Fantasia M.D.   On: 11/24/2017 19:00      Subjective: No chest pain or sob.   Discharge Exam: Vitals:   12/03/17 0243 12/03/17 0829  BP: 122/82 (!) 147/94  Pulse: (!) 58 82  Resp: 18   Temp: 97.8 F (36.6 C) 98.5 F (36.9 C)  SpO2: 100% 97%   Vitals:   12/02/17 2319 12/03/17 0100 12/03/17 0243 12/03/17 0829  BP: 138/83  122/82 (!) 147/94  Pulse: 65  (!) 58 82  Resp: 18  18   Temp: 98  F (36.7 C)  97.8 F (36.6 C) 98.5 F (36.9 C)  TempSrc: Oral  Oral Oral  SpO2: 95%  100% 97%  Weight:  116.8 kg    Height:        General: Pt is alert, awake, not in acute distress Cardiovascular: RRR, S1/S2 +, no rubs, no gallops Respiratory: CTA bilaterally, no wheezing, no rhonchi Abdominal: Soft, NT, ND, bowel sounds + Extremities: no edema, no cyanosis    The results of significant diagnostics from this hospitalization (including imaging, microbiology, ancillary and laboratory) are listed below for reference.     Microbiology: Recent Results (from the past 240 hour(s))  Culture, blood (routine x 2)     Status: None   Collection Time: 11/24/17  7:15 PM  Result Value Ref Range Status   Specimen Description BLOOD BLOOD RIGHT HAND  Final   Special Requests   Final    Blood Culture adequate volume BOTTLES DRAWN AEROBIC ONLY   Culture   Final    NO GROWTH 5 DAYS Performed at [facility], [address], [place], [place] [address]    Report Status 11/29/2017 FINAL  Final  Culture, blood (routine x 2)     Status: None   Collection Time: 11/24/17  7:15 PM  Result Value Ref Range Status   Specimen Description BLOOD BLOOD RIGHT HAND  Final   Special Requests   Final    BOTTLES DRAWN AEROBIC ONLY Blood Culture adequate volume   Culture   Final    NO GROWTH 5 DAYS Performed at [facility], [place] [address], [place], [place] [address]    Report Status 11/29/2017 FINAL  Final  MRSA PCR Screening     Status: None   Collection Time: 11/24/17  7:51 PM  Result Value Ref Range Status   MRSA by PCR NEGATIVE NEGATIVE Final    Comment:        The GeneXpert MRSA Assay (FDA approved for NASAL specimens only), is one component of a comprehensive MRSA colonization surveillance program. It is not intended to diagnose MRSA infection nor to guide or monitor treatment for MRSA infections. Performed at [facility], [place] [address] [place], [place] [address]   Aerobic/Anaerobic Culture (surgical/deep wound)     Status: None   Collection Time: 11/25/17  2:02 AM  Result Value Ref Range Status   Specimen Description ABSCESS  Final   Special Requests BRAIN ID A  Final   Gram Stain NO WBC SEEN NO ORGANISMS SEEN   Final   Culture   Final    NO ANAEROBES ISOLATED Performed at [facility], [address] [place], [place] [address]    Report Status 11/30/2017 FINAL  Final  Aerobic/Anaerobic Culture (surgical/deep wound)     Status: None   Collection Time: 11/25/17  2:46 AM  Result Value Ref Range Status   Specimen Description ABSCESS  Final   Special Requests BRAIN  ID B  Final   Gram Stain   Final    RARE WBC PRESENT, PREDOMINANTLY PMN NO ORGANISMS SEEN    Culture   Final    NO ANAEROBES ISOLATED Performed at [facility], [place] [address] [place], [place] [address]    Report Status 11/30/2017 FINAL  Final  Urine culture     Status: None   Collection Time: 11/25/17   6:01 AM  Result Value Ref Range Status   Specimen Description URINE, CATHETERIZED  Final  Special Requests NONE  Final   Culture   Final    NO GROWTH Performed at [facility], [place] [address], [place], [place] [address]    Report Status 11/26/2017 FINAL  Final  Culture, respiratory (tracheal aspirate)     Status: None   Collection Time: 11/25/17  8:27 AM  Result Value Ref Range Status   Specimen Description TRACHEAL ASPIRATE  Final   Special Requests NONE  Final   Gram Stain   Final    RARE WBC PRESENT,BOTH PMN AND MONONUCLEAR FEW GRAM POSITIVE COCCI IN CLUSTERS    Culture   Final    FEW Consistent with normal respiratory flora. Performed at [facility], [place] [address] [place], [place] [address]    Report Status 11/27/2017 FINAL  Final     Labs: BNP (last 3 results) No results for input(s): BNP in the last 8760 hours. Basic Metabolic Panel: Recent Labs  Lab 11/28/17 0430 11/29/17 0402 11/30/17 0437 12/01/17 0506 12/02/17 0616 12/03/17 0941  NA 143 142 141 140 141  --   K 3.2* 3.0* 2.9* 2.9* 3.2* 3.9  CL 107 109 107 106 104  --   CO2 32 _0 --   GLUCOSE 98 87 97 91 99  --   BUN 7 <5* 7 5* 6  --   CREATININE 0.54* 0.55* 0.58* 0.55* 0.59*  --   CALCIUM 7.8* 8.0* 8.3* 8.3* 8.9  --   MG 2.2  --  2.0  --   --   --   PHOS 2.7  --  2.4*  --   --   --    Liver Function Tests: Recent Labs  Lab 11/28/17 0430  AST 15  ALT 12  ALKPHOS 46  BILITOT 0.4  PROT 5.1*  ALBUMIN 2.2*   No results for input(s): LIPASE, AMYLASE in the last 168 hours. Recent Labs  Lab 11/28/17 1406  AMMONIA 50*   CBC: Recent Labs  Lab 11/28/17 0430 11/28/17 1048 11/29/17 0402 11/30/17 0437 12/02/17 0616  WBC 6.0 7.9 7.4 6.0 8.2  HGB 7.9* 8.5* 8.3* 9.0* 10.9*  HCT 27.1* 27.4* 27.1* 28.3* 33.7*  MCV 93.1 91.9 89.1 87.6 87.3  PLT 203 214 241 287 358   Cardiac Enzymes: No results for input(s): CKTOTAL, CKMB, CKMBINDEX, TROPONINI in the last 168  hours. BNP: Invalid input(s): POCBNP CBG: Recent Labs  Lab 12/02/17 1639 12/02/17 2124 12/03/17 0414 12/03/17 0620 12/03/17 1117  GLUCAP 114* 93 90 99 108*   D-Dimer No results for input(s): DDIMER in the last 72 hours. Hgb A1c No results for input(s): HGBA1C in the last 72 hours. Lipid Profile No results for input(s): CHOL, HDL, LDLCALC, TRIG, CHOLHDL, LDLDIRECT in the last 72 hours. Thyroid function studies No results for input(s): TSH, T4TOTAL, T3FREE, THYROIDAB in the last 72 hours.  Invalid input(s): FREET3 Anemia work up No results for input(s): VITAMINB12, FOLATE, FERRITIN, TIBC, IRON, RETICCTPCT in the last 72 hours. Urinalysis    Component Value Date/Time   COLORURINE YELLOW 11/24/2017 1612   APPEARANCEUR HAZY (A) 11/24/2017 1612   LABSPEC 1.012 11/24/2017 1612   PHURINE 5.0 11/24/2017 1612   GLUCOSEU >=500 (A) 11/24/2017 1612   HGBUR MODERATE (A) 11/24/2017 1612   BILIRUBINUR NEGATIVE 11/24/2017 1612   KETONESUR NEGATIVE 11/24/2017 1612   PROTEINUR 100 (A) 11/24/2017 1612   NITRITE NEGATIVE 11/24/2017 1612   LEUKOCYTESUR NEGATIVE 11/24/2017 1612   Sepsis Labs Invalid input(s): PROCALCITONIN,  WBC,  [facility] Microbiology Recent Results (from the past 240 hour(s))  Culture, blood (routine x 2)     Status: None   Collection Time: 11/24/17  7:15 PM  Result Value Ref Range Status   Specimen Description BLOOD BLOOD RIGHT HAND  Final   Special Requests   Final    Blood Culture adequate volume BOTTLES DRAWN AEROBIC ONLY   Culture   Final    NO GROWTH 5 DAYS Performed at [facility], [address], [place], [place] [address]    Report Status 11/29/2017 FINAL  Final  Culture, blood (routine x 2)     Status: None   Collection Time: 11/24/17  7:15 PM  Result Value Ref Range Status   Specimen Description BLOOD BLOOD RIGHT HAND  Final   Special Requests   Final    BOTTLES DRAWN AEROBIC ONLY Blood Culture adequate volume   Culture   Final    NO  GROWTH 5 DAYS Performed at [facility], [place] [address], [place], [place] [address]    Report Status 11/29/2017 FINAL  Final  MRSA PCR Screening     Status: None   Collection Time: 11/24/17  7:51 PM  Result Value Ref Range Status   MRSA by PCR NEGATIVE NEGATIVE Final    Comment:        The GeneXpert MRSA Assay (FDA approved for NASAL specimens only), is one component of a comprehensive MRSA colonization surveillance program. It is not intended to diagnose MRSA infection nor to guide or monitor treatment for MRSA infections. Performed at [facility], [place] [address], [place], [place] [address]   Aerobic/Anaerobic Culture (surgical/deep wound)     Status: None   Collection Time: 11/25/17  2:02 AM  Result Value Ref Range Status   Specimen Description ABSCESS  Final   Special Requests BRAIN ID A  Final   Gram Stain NO WBC SEEN NO ORGANISMS SEEN   Final   Culture   Final    NO ANAEROBES ISOLATED Performed at [facility], [address] [place], [place] [address]    Report Status 11/30/2017 FINAL  Final  Aerobic/Anaerobic Culture (surgical/deep wound)     Status: None   Collection Time: 11/25/17  2:46 AM  Result Value Ref Range Status   Specimen Description ABSCESS  Final   Special Requests BRAIN  ID B  Final   Gram Stain   Final    RARE WBC PRESENT, PREDOMINANTLY PMN NO ORGANISMS SEEN    Culture   Final    NO ANAEROBES ISOLATED Performed at [facility], [place] [address] [place], [place] [address]    Report Status 11/30/2017 FINAL  Final  Urine culture     Status: None   Collection Time: 11/25/17  6:01 AM  Result Value Ref Range Status   Specimen Description URINE, CATHETERIZED  Final   Special Requests NONE  Final   Culture   Final    NO GROWTH Performed at [facility], [place] [address] [place], [place] [address]    Report Status 11/26/2017 FINAL  Final  Culture, respiratory (tracheal aspirate)     Status: None   Collection Time:  11/25/17  8:27 AM  Result Value Ref Range Status   Specimen Description TRACHEAL ASPIRATE  Final   Special Requests NONE  Final   Gram Stain   Final    RARE WBC PRESENT,BOTH PMN AND MONONUCLEAR FEW GRAM POSITIVE COCCI IN CLUSTERS  Culture   Final    FEW Consistent with normal respiratory flora. Performed at [facility], [place] [address], [place], [place] [address]    Report Status 11/27/2017 FINAL  Final     Time coordinating discharge: 32 minutes  SIGNED:   Hosie Poisson, MD  [facility] 12/03/2017, 11:32 AM Pager   If 7PM-7AM, please contact night-coverage [email or web address] Password [email or web address]

## 2017-12-03 NOTE — Care Management Note (Signed)
Case Management Note  Patient Details  Name: Ryan Bryan MRN: [id number] Date of Birth: 1975/02/27  Subjective/Objective:                    Action/Plan: Pt discharging to his sisters home with IV antibiotics. Sister in law providing transport to the residence.  PT/OT prefer pt goes to outpatient for therapy. Pt in agreement. Orders faxed to [facility] Outpatient therapy.  Pt refused recommended 3 in 1.  Pam with [facility] IV therapy did education with patient in the room.  Expected Discharge Date:  12/03/17               Expected Discharge Plan:  Home w Home Health Services  In-House Referral:  NA  Discharge planning Services  CM Consult  Post Acute Care Choice:  Home Health, Durable Medical Equipment Choice offered to:  Patient  DME Arranged:  IV pump/equipment DME Agency:  [facility]  HH Arranged:  IV Antibiotics, RN HH Agency:  [facility]  Status of Service:  Completed, signed off  If discussed at Long Length of Stay Meetings, dates discussed:    Additional Comments:  Kermit Balo, RN 12/03/2017, 2:03 PM

## 2017-12-04 ENCOUNTER — Telehealth: Payer: Self-pay | Admitting: Behavioral Health

## 2017-12-04 NOTE — Telephone Encounter (Signed)
Lyla Son called from [facility] and stated she went out to see Ryan Bryan, Ryan Bryan today and she wanted Dr. Drue Second to know that he has not started taking his oral Zyvox and Fluconazole.  She states patient stated he was going to pick it upsnid some time today but wanted Dr. Drue Second to be aware. Angeline Slim RN

## 2017-12-05 NOTE — Telephone Encounter (Signed)
Can you call the patient to see if he has picked it up yet. And if not, could we have pharmacy deliver it to him. Thank you.

## 2017-12-10 NOTE — Telephone Encounter (Signed)
Called patient verified identity.  Patient states he has picked up his medication and states he has been taking them as directed.  Patient states he was late picking them up due to his finances. Angeline Slim RN

## 2018-01-01 ENCOUNTER — Encounter: Payer: Self-pay | Admitting: Family

## 2018-01-01 ENCOUNTER — Ambulatory Visit (INDEPENDENT_AMBULATORY_CARE_PROVIDER_SITE_OTHER): Payer: Self-pay | Admitting: Family

## 2018-01-01 ENCOUNTER — Telehealth: Payer: Self-pay

## 2018-01-01 VITALS — BP 155/89 | HR 89 | Temp 98.6°F | Wt 278.0 lb

## 2018-01-01 DIAGNOSIS — G062 Extradural and subdural abscess, unspecified: Secondary | ICD-10-CM

## 2018-01-01 NOTE — Assessment & Plan Note (Signed)
Mr. Ryan Bryan continues to improve and receive treatment for subdural empyema. His neurological exam is normal and he has remained afebrile and seizure free. He does have a new area of concern on the left side of his head. Will obtain MRI imaging for abscess follow up and to aid in identification of the new lesion. He has follow up with Neurosurgery on 12/5. Will continue current dose of linezolid, metronidazole and ceftriaxone until 12/6 pending MRI results to ensure resolution of abscess. Follow up at the completion of antibiotic therapy.

## 2018-01-01 NOTE — Telephone Encounter (Signed)
Per Marcos EkeGreg Calone NP, called [facility] to extend antibiotic, Ceftriaxone until 01/11/2018. Spoke to [employer] and notified of new end date.  Gerarda FractionGuadalupe C , [place]MA

## 2018-01-01 NOTE — Progress Notes (Signed)
Subjective:    Patient ID: Ryan Bryan, male    DOB: 1975/12/22, 42 y.o.   MRN: [id number]  Chief Complaint  Patient presents with  . Subdural Empyema    HPI:  Ryan Bryan is a 42 y.o. male who presents today for initial office visit following hospitalization.   Ryan Bryan has a previous medical history relevant for coronary artery disease requiring stent placement and cocaine abuse who was admitted to the hospital with status epilepticus and subdural empyema.  About 1 month prior to presentation he experienced trauma to his left eye with CT scan showing an increase in extraconal hematoma with small loculation of air concerning for infection.  He was seen at [facility] with worsening pain with concern for orbital cellulitis with sinusitis of frontal/anterior ethmoid and maxillary sinuses on the left.  On 10/2 he underwent endoscopic sinus decompression with pus noted during the procedure.  This was followed by ophthalmology performing a left orbitotomy with evacuation of pus and cultures positive for strep viridans.  Experienced trauma to his eye in September 2019 and noted to have a "extra corneal hematoma with small loculation of air concerning for infection."  He was seen at [facility] and found to have orbital cellulitis and sinusitis of multiple sinuses.  ENT performed an endoscopic sinus decompression on 10/2 and noted pus followed by ophthalmology performing a left orbitotomy with evacuation of pus and culture grew strep viridans.  He was discharged with Bactrim and Augmentin.  He arrived at [facility] on 10/19 with status epilepticus with MRI of the brain showing 3 anterior cranial fossa subdural empyemas with surrounding vasogenic edema worse on the left with the largest collection measuring 3.1 x 2.6 x 1.4 cm.  He underwent bilateral craniotomy for infection evacuation and skull base defect repair.  Surgical notes indicate phlegmon in the frontal sinus with a subdural empyema  on the right with copious amounts of purulent material.  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  All cultures obtained during surgery were were without growth and no organisms seen on Gram stain.  He was placed on linezolid, ceftriaxone, and metronidazole for broad-spectrum coverage for 6 weeks with an date of 01/05/2018.  All hospital records, labs, and imaging reviewed in detail. 27/5  18/4 5.9  Ryan Bryan has been doing well since leaving the hospital and is eager to return to work. He continues to take his linezolid, ceftriaxone and metronidazole as prescribed with no adverse side effects or missed doses. PICC line is functioning appropriately and denies any signs of infection. No fevers, chills, sweats, or seizures. Surgical wound is doing well and has follow up with Neurosurgery on 12/5. He has noted in the past 1-1.5 weeks that he has a new fluid filled area located on the left side of his head just anterior to his surgical site. Does not think that it has changed in size and is not tender. No redness or drainage.   No Known Allergies    Outpatient Medications Prior to Visit  Medication Sig Dispense Refill  . cefTRIAXone (ROCEPHIN) IVPB Inject 2 g into the vein every 12 (twelve) hours. Indication:  Subdural empyema / status epilepticus Last Day of Therapy:  01/05/18 Labs - Once weekly:  CBC/D and BMP, Labs - Every other week:  ESR and CRP 74 Units 0  . divalproex (DEPAKOTE) 250 MG DR tablet Take 3 tablets (750 mg total) by mouth every  8 (eight) hours. 90 tablet 0  . lacosamide 100 MG TABS Take 1 tablet (100 mg total) by mouth 2 (two) times daily. 60 tablet 1  . levETIRAcetam (KEPPRA) 750 MG tablet Take 2 tablets (1,500 mg total) by mouth 2 (two) times daily. 60 tablet 0  . linezolid (ZYVOX) 600 MG tablet Take 1 tablet (600 mg total) by mouth 2 (two) times daily. 60 tablet 0  . metroNIDAZOLE (FLAGYL) 500 MG tablet Take 1 tablet (500 mg  total) by mouth every 8 (eight) hours. 102 tablet 0   No facility-administered medications prior to visit.      History reviewed. No pertinent past medical history.    Past Surgical History:  Procedure Laterality Date  . CRANIOTOMY Bilateral 11/24/2017   Procedure: Bilateral Craniotomy for Infection Evacuation and Skull Base Defect Repair;  Surgeon: Ryan Part, MD;  Location: [facility];  Service: Neurosurgery;  Laterality: Bilateral;      History reviewed. No pertinent family history.    Social History   Socioeconomic History  . Marital status: Single    Spouse name: Not on file  . Number of children: Not on file  . Years of education: Not on file  . Highest education level: Not on file  Occupational History  . Not on file  Social Needs  . Financial resource strain: Not on file  . Food insecurity:    Worry: Not on file    Inability: Not on file  . Transportation needs:    Medical: Not on file    Non-medical: Not on file  Tobacco Use  . Smoking status: Current Every Day Smoker    Types: Cigarettes  . Smokeless tobacco: Never Used  Substance and Sexual Activity  . Alcohol use: Not on file  . Drug use: Not on file  . Sexual activity: Not on file  Lifestyle  . Physical activity:    Days per week: Not on file    Minutes per session: Not on file  . Stress: Not on file  Relationships  . Social connections:    Talks on phone: Not on file    Gets together: Not on file    Attends religious service: Not on file    Active member of club or organization: Not on file    Attends meetings of clubs or organizations: Not on file    Relationship status: Not on file  . Intimate partner violence:    Fear of current or ex partner: Not on file    Emotionally abused: Not on file    Physically abused: Not on file    Forced sexual activity: Not on file  Other Topics Concern  . Not on file  Social History Narrative  . Not on file    Review of Systems    Constitutional: Negative for activity change, chills, fatigue and fever.  Respiratory: Negative for cough, chest tightness, shortness of breath and wheezing.   Cardiovascular: Negative for chest pain, palpitations and leg swelling.  Gastrointestinal: Negative for abdominal distention, abdominal pain, constipation, diarrhea, nausea and vomiting.  Skin: Negative for rash.  Neurological: Negative for seizures, syncope, speech difficulty, weakness, light-headedness, numbness and headaches.       Objective:    BP (!) 155/89   Pulse 89   Temp 98.6 F (37 C)   Wt 278 lb (126.1 kg)   BMI 36.68 kg/m  Nursing note and vital signs reviewed.  Physical Exam  Constitutional: He is oriented to person, place, and time.  He appears well-developed and well-nourished. No distress.  HENT:  Surgical incision appears with good approximation and healing and is without evidence of infection.   Eyes: Pupils are equal, round, and reactive to light. Conjunctivae and EOM are normal. Left eye exhibits no discharge.  Neck: Neck supple.  Cardiovascular: Normal rate, regular rhythm, normal heart sounds and intact distal pulses. Exam reveals no gallop and no friction rub.  No murmur heard. Pulmonary/Chest: Effort normal and breath sounds normal. No respiratory distress. He has no rales. He exhibits no tenderness.  Lymphadenopathy:    He has no cervical adenopathy.  Neurological: He is alert and oriented to person, place, and time. No cranial nerve deficit.  Skin: Skin is warm and dry.  Psychiatric: He has a normal mood and affect.        Assessment & Plan:   Problem List Items Addressed This Visit      Nervous and Auditory   Subdural empyema - Primary    Mr. Tomas continues to improve and receive treatment for subdural empyema. His neurological exam is normal and he has remained afebrile and seizure free. He does have a new area of concern on the left side of his head. Will obtain MRI imaging for abscess  follow up and to aid in identification of the new lesion. He has follow up with Neurosurgery on 12/5. Will continue current dose of linezolid, metronidazole and ceftriaxone until 12/6 pending MRI results to ensure resolution of abscess. Follow up at the completion of antibiotic therapy.       Relevant Orders   MR BRAIN W WO CONTRAST       I am having Ryan Bryan maintain his cefTRIAXone, Lacosamide, divalproex, levETIRAcetam, metroNIDAZOLE, and linezolid.   Follow-up: Return in about 2 weeks (around 01/15/2018), or if symptoms worsen or fail to improve.    Terri Piedra, MSN, FNP-C Nurse Practitioner [facility] Office phone: [phone] Pager: [place] number: [phone]

## 2018-01-01 NOTE — Patient Instructions (Signed)
Nice to see you. Glad you are doing well!  Please continue to take your ceftriaxone, Zyvox and Metronidazole.  We are going to extend you out to 12/6 given the new fluid collection.  We are going to obtain an MRI to ensure resolution of your abscesses.  Continue to follow up with Dr. Dolphus Jennysterguard as planned.  Plan for follow up with us in 3 weeks or sooner if needed.  If you need additional medication, please let us know.

## 2018-01-14 ENCOUNTER — Other Ambulatory Visit: Payer: Self-pay | Admitting: Internal Medicine

## 2018-01-14 ENCOUNTER — Emergency Department (HOSPITAL_BASED_OUTPATIENT_CLINIC_OR_DEPARTMENT_OTHER): Payer: Self-pay

## 2018-01-14 ENCOUNTER — Encounter (HOSPITAL_COMMUNITY): Payer: Self-pay

## 2018-01-14 ENCOUNTER — Emergency Department (HOSPITAL_COMMUNITY)
Admission: EM | Admit: 2018-01-14 | Discharge: 2018-01-14 | Disposition: A | Discharge status: Home / Self Care | Payer: Self-pay | Attending: Emergency Medicine | Admitting: Emergency Medicine

## 2018-01-14 ENCOUNTER — Encounter: Payer: Self-pay | Admitting: Internal Medicine

## 2018-01-14 ENCOUNTER — Ambulatory Visit (INDEPENDENT_AMBULATORY_CARE_PROVIDER_SITE_OTHER): Payer: Self-pay | Admitting: Internal Medicine

## 2018-01-14 ENCOUNTER — Other Ambulatory Visit: Payer: Self-pay

## 2018-01-14 ENCOUNTER — Telehealth: Payer: Self-pay | Admitting: *Deleted

## 2018-01-14 ENCOUNTER — Ambulatory Visit (HOSPITAL_COMMUNITY): Admission: RE | Admit: 2018-01-14 | Payer: Self-pay | Source: Ambulatory Visit

## 2018-01-14 VITALS — BP 162/104 | HR 98 | Temp 98.5°F | Wt 284.0 lb

## 2018-01-14 DIAGNOSIS — R609 Edema, unspecified: Secondary | ICD-10-CM

## 2018-01-14 DIAGNOSIS — Z959 Presence of cardiac and vascular implant and graft, unspecified: Secondary | ICD-10-CM | POA: Insufficient documentation

## 2018-01-14 DIAGNOSIS — I10 Essential (primary) hypertension: Secondary | ICD-10-CM | POA: Insufficient documentation

## 2018-01-14 DIAGNOSIS — G062 Extradural and subdural abscess, unspecified: Secondary | ICD-10-CM

## 2018-01-14 DIAGNOSIS — M7989 Other specified soft tissue disorders: Secondary | ICD-10-CM | POA: Insufficient documentation

## 2018-01-14 DIAGNOSIS — I252 Old myocardial infarction: Secondary | ICD-10-CM | POA: Insufficient documentation

## 2018-01-14 DIAGNOSIS — G40901 Epilepsy, unspecified, not intractable, with status epilepticus: Secondary | ICD-10-CM

## 2018-01-14 DIAGNOSIS — F17210 Nicotine dependence, cigarettes, uncomplicated: Secondary | ICD-10-CM | POA: Insufficient documentation

## 2018-01-14 DIAGNOSIS — I808 Phlebitis and thrombophlebitis of other sites: Secondary | ICD-10-CM | POA: Insufficient documentation

## 2018-01-14 DIAGNOSIS — I82A11 Acute embolism and thrombosis of right axillary vein: Secondary | ICD-10-CM | POA: Insufficient documentation

## 2018-01-14 HISTORY — DX: Acute myocardial infarction, unspecified: I21.9

## 2018-01-14 HISTORY — DX: Essential (primary) hypertension: I10

## 2018-01-14 LAB — BASIC METABOLIC PANEL
Anion gap: 7 (ref 5–15)
BUN: 7 mg/dL (ref 6–20)
CALCIUM: 8.8 mg/dL — AB (ref 8.9–10.3)
CO2: 24 mmol/L (ref 22–32)
CREATININE: 0.51 mg/dL — AB (ref 0.61–1.24)
Chloride: 109 mmol/L (ref 98–111)
GFR calc Af Amer: >60 mL/min (ref >60)
GLUCOSE: 88 mg/dL (ref 70–99)
Potassium: 3.6 mmol/L (ref 3.5–5.1)
Sodium: 140 mmol/L (ref 135–145)

## 2018-01-14 LAB — CBC WITH DIFFERENTIAL/PLATELET
ABS IMMATURE GRANULOCYTES: 0.05 10*3/uL (ref 0.00–0.07)
BASOS PCT: 0 %
Basophils Absolute: 0 10*3/uL (ref 0.0–0.1)
EOS ABS: 0.2 10*3/uL (ref 0.0–0.5)
EOS PCT: 1 %
HCT: 40.5 % (ref 39.0–52.0)
Hemoglobin: 12.9 g/dL — ABNORMAL LOW (ref 13.0–17.0)
Immature Granulocytes: 1 %
Lymphocytes Relative: 18 %
Lymphs Abs: 1.9 10*3/uL (ref 0.7–4.0)
MCH: 28.5 pg (ref 26.0–34.0)
MCHC: 31.9 g/dL (ref 30.0–36.0)
MCV: 89.6 fL (ref 80.0–100.0)
MONO ABS: 0.6 10*3/uL (ref 0.1–1.0)
MONOS PCT: 6 %
Neutro Abs: 8.1 10*3/uL — ABNORMAL HIGH (ref 1.7–7.7)
Neutrophils Relative %: 74 %
Platelets: 246 10*3/uL (ref 150–400)
RBC: 4.52 MIL/uL (ref 4.22–5.81)
RDW: 14 % (ref 11.5–15.5)
WBC: 10.8 10*3/uL — ABNORMAL HIGH (ref 4.0–10.5)
nRBC: 0 % (ref 0.0–0.2)

## 2018-01-14 MED ORDER — RIVAROXABAN (XARELTO) VTE STARTER PACK (15 & 20 MG): ORAL_TABLET | ORAL | 0 refills | Status: AC

## 2018-01-14 NOTE — ED Provider Notes (Signed)
MD a [facility]-EM[facility]GENCY DEPT Provider Note   CSN: [id number] Arrival date & time: 01/14/18  1243     History   Chief Complaint Chief Complaint  Patient presents with  . Arm Swelling    HPI Ryan Bryan is a 42 y.o. male with a hx of tobacco abuse, CAD, and cerebral empyema which required surgical evacuation by Dr. Johnsie Cancel 10/19 followed by 8 weeks of IV abx via PICC line followed by ID Dr. Luciana Axe who presents to the ED with RUE swelling x yesterday.  Patient has been on Zyvox, Ceftriaxone, & Flagyl x 8 weeks, recently completed with plan for PICC removal. He developed diffuse swelling to the RUE yesterday morning, progressively worsening, it is mildly uncomfortable as well. Sxs improved with elevated of the upper extremity, no aggravating factors. He has noted some mild redness to the medial elbow, but no other areas, not warm to the touch. He was seen by Dr. Luciana Axe, ID, today, there was concern for DVT, he was sent to the ED for further evaluation. Per Dr. Ephriam Knuckles note did not appear overly erythematous or warm. Per Dr. Ephriam Knuckles note there is also plan for PICC removal, but w/ possible clot complication this was deferred. Patient denies fever, chills, nausea, vomiting, numbness, or weakness. He denies chest pain, dyspnea hemoptysis, recent long travel, hormone use, personal hx of cancer, or hx of DVT/PE. Most recent surgery was for craniotomy 10/19.   HPI  Past Medical History:  Diagnosis Date  . Hypertension   . MI (myocardial infarction) [facility])     Patient Active Problem List   Diagnosis Date Noted  . Arm swelling 01/14/2018  . 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 Status epilepticus (HCC) 11/24/2017    Past Surgical History:  Procedure Laterality Date  . CORONARY ANGIOPLASTY WITH STENT PLACEMENT     LAD stent  . CRANIOTOMY Bilateral 11/24/2017   Procedure: Bilateral Craniotomy for Infection Evacuation and Skull Base Defect Repair;  Surgeon: Jadene Pierini, MD;  Location: [facility] OR;  Service: Neurosurgery;  Laterality: Bilateral;        Home Medications    Prior to Admission medications   Medication Sig Start Date End Date Taking? Authorizing Provider  acetaminophen (TYLENOL) 325 MG tablet Take 650 mg by mouth every 6 (six) hours as needed (pain).   Yes [provider]    Family History Family History  Problem Relation Age of Onset  . Diabetes Mother   . Heart failure Mother   . Diabetes Father   . Heart failure Father     Social History Social History   Tobacco Use  . Smoking status: Current Every Day Smoker    Packs/day: 1.00    Types: Cigarettes  . Smokeless tobacco: Never Used  Substance Use Topics  . Alcohol use: Yes    Comment: socially  . Drug use: Yes    Types: Marijuana    Comment: socially     Allergies   Patient has no known allergies.   Review of Systems Review of Systems  Constitutional: Negative for chills and fever.  Respiratory: Negative for shortness of breath.   Cardiovascular: Negative for chest pain.  Gastrointestinal: Negative for abdominal pain.  Musculoskeletal:       Positive for RUE swelling and some pain.   Skin: Positive for color change (mild erythema to medial elbow). Negative for rash and wound.  Neurological: Negative for weakness and numbness.  All other systems reviewed and are negative.  Physical Exam Updated Vital Signs BP (!) 133/91 (BP Location: Left Arm)   Pulse 73   Temp 98.3 F (36.8 C)   Resp 18   Ht 6\' 1"  (1.854 m)   Wt 128.8 kg   SpO2 98%   BMI 37.47 kg/m   Physical Exam  Constitutional: He appears well-developed and well-nourished.  Non-toxic appearance. No distress.  HENT:  Head: Normocephalic and atraumatic.  Eyes: Conjunctivae are normal. Right eye exhibits no discharge. Left eye exhibits no discharge.  Neck: Neck supple.  Cardiovascular: Normal rate and regular rhythm.  Difficulty palpating radial pulse in RUE which patient  states is typical w/ his prior injuries. Easily audible with doppler.   Pulmonary/Chest: Effort normal and breath sounds normal. No respiratory distress. He has no wheezes. He has no rhonchi. He has no rales.  Respiration even and unlabored  Abdominal: Soft. He exhibits no distension. There is no tenderness.  Musculoskeletal:  Upper extremities: Digits to RUE are contracted w/ scar tissue, baseline from childhood injury. The RUE is diffusely swollen, trace pitting edema noted. There is some very mild erythema noted to the medial elbow, not in a lymphatic pattern, not warm to touch. PICC Line to medial upper arm without significant redness, warmth, or purulent drainage. Full AROM to the shoulders, elbows, and wrists. No point/focal tenderness to palpation. Compartments are fairly soft.   Neurological: He is alert.  Clear speech. Sensation grossly intact to bilateral upper extremities. 5/5 symmetric grip strength.   Skin: Skin is warm and dry. No rash noted.  Psychiatric: He has a normal mood and affect. His behavior is normal.  Nursing note and vitals reviewed.    ED Treatments / Results  Labs (all labs ordered are listed, but only abnormal results are displayed) Labs Reviewed - No data to display  EKG None  Radiology No results found.  Procedures Procedures (including critical care time)  Medications Ordered in ED Medications - No data to display   Initial Impression / Assessment and Plan / ED Course  I have reviewed the triage vital signs and the nursing notes.  Pertinent labs & imaging results that were available during my care of the patient were reviewed by me and considered in my medical decision making (see chart for details).    Patient presents to the emergency department with left upper extremity swelling and mild discomfort that started yesterday morning.  Patient has PICC line in place.  On exam he does have notable trace edema.  No erythema, not warm to touch,  afebrile, lower suspicion for infectious process.  Good range of motion.  Neurovascularly intact distally.  Concern for DVT.  Ultrasound and labs ordered.  Per vascular technician patient's Doppler study is positive for DVT.  DVT involves the right subclavian, axillary, and brachial veins.  He also has findings consistent with acute superficial vein thrombosis involving the right cephalic vein and right basilic vein.  Thrombus does appear to surround PICC line. Patient without dyspnea/chest pain or tachycardia to raise concern for PE.   Consult placed to neurosurgery to discuss clearance for anticoagulation with likely subsequent consult to vascular for anticoagulation recommendations.   17:04: CONSULT: Discussed case with neurosurgeon Dr. Jordan Likes- instructs cleared for anticoagulation.   17:06: CONSULT: Discussed with vascular surgeon Dr. Randie Heinz- recommends starting anticoagulation w/ NOAC. Options include start NOAC w/ discharge home and PICC removal after anticoagulated outpatient vs. ED heparin bolus w/ hospital removal of PICC and NOAC at discharge. Patient to follow up with PCP  does not require follow up with vascular.   Discussed with supervising physician Dr. Erma HeritageIsaacs recommends start NOAC in the ED with outpatient PICC removal- patient states Dr. Luciana Axeomer was initially planning for removal in office, he will follow up there.   Renal function preserved. No identifiable contraindications to anticoagulation per discussion with the patient. Discussed with clinical pharmacist Terri who has provided patient with education regarding anticoagulation as well as discount for financial assistance, will be started on Xarelto. I discussed results, tx plan, return precautions (specifically bleeding/PE info), and need for follow up with the patient. Provided opportunity for questions, patient confirmed understanding and is in agreement with plan.   Final Clinical Impressions(s) / ED Diagnoses   Final diagnoses:    Acute deep vein thrombosis (DVT) of axillary vein of right upper extremity (HCC)  Superficial thrombophlebitis of right upper extremity    ED Discharge Orders         Ordered    Rivaroxaban 15 & 20 MG TBPK     01/14/18 1809           Cherly Andersonetrucelli, Daphane Odekirk R, PA-C 01/14/18 1819    Shaune PollackIsaacs, Cameron, MD 01/15/18 1725

## 2018-01-14 NOTE — ED Triage Notes (Signed)
Patient reports that he woke up yesterday and had swelling and pain to the right arm where his PICC line is. Worsening pain to the right arm since yesterday. Patient states he flushed the PICC line yesterday with Saline and Heparin.

## 2018-01-14 NOTE — ED Notes (Addendum)
Used doppler for right radial pulse. Faint. P-70.

## 2018-01-14 NOTE — Telephone Encounter (Signed)
Dr Luciana Axeomer advised patient to go to ER for evaluation of swelling in arm with PICC and subsequent PICC removal. Patient verbalized understanding, agreement. [facility] notified Eunice Blase(Debbie). Andree CossHowell, Casin Federici M, RN

## 2018-01-14 NOTE — Discharge Instructions (Addendum)
You are seen in the emergency department today for upper extremity swelling.  The ultrasound performed showed that you have a deep vein thrombosis in the right upper extremity as well as a superficial vein thrombosis.  WE have started you on Xarelto, a blood thinner, to treat the clot.  You will need to follow-up with your primary care for discussion of removal of the PICC line in your right upper extremity within the next 1 week.  Return to the ER for new or worsening symptoms including but not limited to shortness of breath, chest pain, head injury, blood in your stool, blood in your urine, coughing up blood, change in your vision, numbness, weakness, trouble walking, or any other concerns.   Information on my medicine - XARELTO (rivaroxaban)  This medication education was reviewed with me or my healthcare representative as part of my discharge preparation.  The pharmacist that spoke with me during my hospital stay was:  Otho BellowsGreen, Terri L, [facility] Hospital[facility]PH  WHY WAS XARELTO PRESCRIBED FOR YOU? Xarelto was prescribed to treat blood clots that may have been found in the veins of your legs (deep vein thrombosis) or in your lungs (pulmonary embolism) and to reduce the risk of them occurring again.  WHAT DO YOU NEED TO KNOW ABOUT XARELTO? The starting dose is one 15 mg tablet taken TWICE daily with food for the FIRST 21 DAYS then on 12/31 the dose is changed to one 20 mg tablet taken ONCE A DAY with your evening meal.  DO NOT stop taking Xarelto without talking to the health care provider who prescribed the medication.  Refill your prescription for 20 mg tablets before you run out.  After discharge, you should have regular check-up appointments with your healthcare provider that is prescribing your Xarelto.  In the future your dose may need to be changed if your kidney function changes by a significant amount.  WHAT DO YOU DO IF YOU MISS A DOSE? If you are taking Xarelto TWICE DAILY and you miss a dose, take  it as soon as you remember. You may take two 15 mg tablets (total 30 mg) at the same time then resume your regularly scheduled 15 mg twice daily the next day.  If you are taking Xarelto ONCE DAILY and you miss a dose, take it as soon as you remember on the same day then continue your regularly scheduled once daily regimen the next day. Do not take two doses of Xarelto at the same time.   IMPORTANT SAFETY INFORMATION Xarelto is a blood thinner medicine that can cause bleeding. You should call your healthcare provider right away if you experience any of the following: Bleeding from an injury or your nose that does not stop. Unusual colored urine (red or dark brown) or unusual colored stools (red or black). Unusual bruising for unknown reasons. A serious fall or if you hit your head (even if there is no bleeding).  Some medicines may interact with Xarelto and might increase your risk of bleeding while on Xarelto. To help avoid this, consult your healthcare provider or pharmacist prior to using any new prescription or non-prescription medications, including herbals, vitamins, non-steroidal anti-inflammatory drugs (NSAIDs) and supplements.  This website has more information on Xarelto: VisitDestination.com.[email or web address].xarelto.com.

## 2018-01-14 NOTE — ED Notes (Signed)
US at bedside

## 2018-01-14 NOTE — Progress Notes (Signed)
   Subjective:    Patient ID: Ryan Bryan, male    DOB: 11-20-1975, 42 y.o.   MRN: [id number]  HPI He is here for follow-up of his cerebral empyema. He was in the hospital in October due to subdural empyema requiring surgical evacuation by Dr. Johnsie Cancelstergaard and placed on empiric therapy for culture-negative abscess with Zyvox, ceftriaxone and Flagyl.  Initially was on vancomycin but could not get levels up enough.  He has completed about 8 weeks of therapy through December 6 and is doing well.  His incision is cleared up.  He is due for a repeat MRI on Sunday, December 15. Unfortunately, his right arm where his PICC line is is now swollen.  No significant warmth or tenderness.   Review of Systems  Constitutional: Negative for chills and fever.  Gastrointestinal: Negative for diarrhea.  Skin: Negative for rash.       Objective:   Physical Exam  Constitutional: He appears well-developed and well-nourished.  HENT:  Incision on scalp closed, no drainage, no surrounding erythema  Cardiovascular: Normal rate, regular rhythm and normal heart sounds.  No murmur heard. Pulmonary/Chest: Effort normal and breath sounds normal. No respiratory distress.  Skin: No rash noted.   SH: no drug use now       Assessment & Plan:

## 2018-01-14 NOTE — Assessment & Plan Note (Signed)
Has remained seizure free and off of medication

## 2018-01-14 NOTE — ED Notes (Signed)
Bed: WA06 Expected date:  Expected time:  Means of arrival:  Comments: Hold for triage 4

## 2018-01-14 NOTE — Progress Notes (Addendum)
Right upper extremity duplex completed. DVT and SVT noted in right upper extremity. Preliminary results discussed with Harvie HeckSamantha Petrucelli, PA-C.  Refer to "CV Proc" under chart review to view full preliminary results.  01/14/2018 4:31 PM Gertie FeyMichelle Karri Kallenbach, MHA, RVT, RDCS, RDMS

## 2018-01-14 NOTE — Assessment & Plan Note (Addendum)
This is a new problem.  I am concerned there is a DVT.  I will have him evaluated in the emergency room for this, ? Doppler for DVT.    His picc line is ready to be pulled but prefer to have it done in the hospital with concern for clot with evaluation first. I appreciate the EDs help with this.

## 2018-01-14 NOTE — Assessment & Plan Note (Signed)
Has done well.  No current concerns. Follow up MRI Sunday.  Will inform him of results

## 2018-01-15 ENCOUNTER — Ambulatory Visit (INDEPENDENT_AMBULATORY_CARE_PROVIDER_SITE_OTHER): Payer: Self-pay | Admitting: Internal Medicine

## 2018-01-15 ENCOUNTER — Telehealth: Payer: Self-pay | Admitting: Pharmacist

## 2018-01-15 ENCOUNTER — Encounter: Payer: Self-pay | Admitting: Internal Medicine

## 2018-01-15 VITALS — BP 166/109 | HR 97 | Temp 97.9°F | Wt 282.0 lb

## 2018-01-15 DIAGNOSIS — Z452 Encounter for adjustment and management of vascular access device: Secondary | ICD-10-CM

## 2018-01-15 DIAGNOSIS — I82629 Acute embolism and thrombosis of deep veins of unspecified upper extremity: Secondary | ICD-10-CM

## 2018-01-15 DIAGNOSIS — G062 Extradural and subdural abscess, unspecified: Secondary | ICD-10-CM

## 2018-01-15 MED ORDER — RIVAROXABAN 20 MG PO TABS: 20.0000 mg | ORAL_TABLET | Freq: Every day | ORAL | 1 refills | Status: DC

## 2018-01-15 NOTE — Telephone Encounter (Signed)
Patient was recently diagnosed with a DVT in his upper arm from his PICC line.  Patient is uninsured, so [employer] sent in an application to the manufacturer for approval.  The company states it will take 5-7 days for approval.  Tessie Fassalled Erika, PharmD at the [facility], and they have Xarelto samples that they are willing to give patient to bridge him until he can get approval.  Called patient and gave him directions to the clinic. He will go there today and pick it up.  Will inform him when he is approved for patient assistance.

## 2018-01-15 NOTE — Patient Instructions (Signed)
Follow- up with Dr. Luciana Axeomer in 2-3 weeks.

## 2018-01-16 ENCOUNTER — Encounter: Payer: Self-pay | Admitting: Internal Medicine

## 2018-01-16 DIAGNOSIS — I82629 Acute embolism and thrombosis of deep veins of unspecified upper extremity: Secondary | ICD-10-CM | POA: Insufficient documentation

## 2018-01-16 DIAGNOSIS — Z452 Encounter for adjustment and management of vascular access device: Secondary | ICD-10-CM | POA: Insufficient documentation

## 2018-01-16 NOTE — Assessment & Plan Note (Signed)
picc removed by me, no issues, no clots noted on picc line.  No bleeding after.

## 2018-01-16 NOTE — Assessment & Plan Note (Signed)
Completed treatment. MRI pending but off of antiiotics now.

## 2018-01-16 NOTE — Progress Notes (Signed)
   Subjective:    Patient ID: Ryan Bryan, male    DOB: 1976-01-06, 42 y.o.   MRN: [id number]  HPI Here for follow up of ED visit. I saw him yesterday after he has completed his IV treatment for subdural emypema and his arm at the site of the picc was swollen.  I sent him to the ED to evaluated for acute DVT and have picc removed.  He was diagnosed with a DVT, given a prescription for Xarelto and told to follow up with me for picc removal after starting Xarelto.  He though is uninsured and unable to afford Xarelto.  He is here for picc removal.  Doppler noted and DVT in the subclavian, right axillary and brachial vein, right.  Also superficial in cephalic and basilic veins.     Review of Systems  Constitutional: Negative for chills and fever.  Musculoskeletal:       Right arm swelling       Objective:   Physical Exam  Constitutional: He appears well-developed and well-nourished. No distress.  Eyes: No scleral icterus.  Cardiovascular: Normal rate, regular rhythm and normal heart sounds.  Pulmonary/Chest: Effort normal and breath sounds normal. No respiratory distress.  Musculoskeletal:  Right arm edema  Skin: No rash noted.   SH: + tobacco       Assessment & Plan:

## 2018-01-16 NOTE — Assessment & Plan Note (Signed)
New problem.  Will get him a supply of Xarelto then through the drug company.  I appreciate the support of the cardiology clinic with samples.  Provoked so plan to treat for 3 months through April 16 2018.  Will follow up with me in about 2 months.

## 2018-01-18 ENCOUNTER — Telehealth: Payer: Self-pay

## 2018-01-18 NOTE — Telephone Encounter (Signed)
[facility] not going out this week since PICC was removed at our office.   Laurell Josephsammy K Adriel Desrosier, RN

## 2018-01-20 ENCOUNTER — Ambulatory Visit
Admission: RE | Admit: 2018-01-20 | Discharge: 2018-01-20 | Disposition: A | Discharge status: Home / Self Care | Payer: No Typology Code available for payment source | Source: Ambulatory Visit | Attending: Family | Admitting: Family

## 2018-01-20 DIAGNOSIS — G062 Extradural and subdural abscess, unspecified: Secondary | ICD-10-CM

## 2018-01-20 MED ORDER — GADOBENATE DIMEGLUMINE 529 MG/ML IV SOLN
20.0000 mL | Freq: Once | INTRAVENOUS | Status: AC | PRN
  Administered 2018-01-20: 20 mL via INTRAVENOUS

## 2018-01-22 ENCOUNTER — Telehealth: Payer: Self-pay | Admitting: *Deleted

## 2018-01-22 ENCOUNTER — Other Ambulatory Visit (HOSPITAL_COMMUNITY): Payer: Self-pay | Admitting: Neurological Surgery

## 2018-01-22 DIAGNOSIS — G060 Intracranial abscess and granuloma: Secondary | ICD-10-CM

## 2018-01-22 NOTE — Telephone Encounter (Signed)
Patient called to get the results of his recent MRI scan. Advised it is done and I will have the provider look and someone will give him a call once he does.

## 2018-01-22 NOTE — Telephone Encounter (Signed)
Infection is gone and looks good.  Does he have an appt with neurosurgery Dr. Maurice Smallstergard this week?   Thanks

## 2018-01-23 NOTE — Telephone Encounter (Signed)
Patient called back about MRI results.  Informed patient per Dr. Luciana Axeomer that his infection is gone and looks good.  Patient states he does not have a follow up appointment with Dr. Maurice Smallstergard.  Patient is aware of his follow up with Dr. Luciana Axeomer Monday 01/28/2018 Angeline SlimAshley Hill RN

## 2018-01-28 ENCOUNTER — Ambulatory Visit: Payer: Self-pay | Admitting: Internal Medicine

## 2018-01-31 ENCOUNTER — Ambulatory Visit (HOSPITAL_COMMUNITY): Payer: Self-pay | Attending: Neurological Surgery

## 2018-01-31 ENCOUNTER — Encounter (HOSPITAL_COMMUNITY): Payer: Self-pay

## 2018-03-04 ENCOUNTER — Other Ambulatory Visit: Payer: Self-pay

## 2018-03-04 DIAGNOSIS — I82629 Acute embolism and thrombosis of deep veins of unspecified upper extremity: Secondary | ICD-10-CM

## 2018-03-04 MED ORDER — RIVAROXABAN 20 MG PO TABS: 20.0000 mg | ORAL_TABLET | Freq: Every day | ORAL | 1 refills | Status: DC

## 2018-03-04 MED ORDER — RIVAROXABAN 20 MG PO TABS: 20.0000 mg | ORAL_TABLET | Freq: Every day | ORAL | 1 refills | Status: AC

## 2018-03-14 ENCOUNTER — Ambulatory Visit: Payer: Self-pay | Admitting: Internal Medicine

## 2019-06-16 IMAGING — DX DG CHEST 1V PORT
1 series · 2 of 2 positions shown · non-contrast
Comparison: None.

CLINICAL DATA: Status post intubation today. Patient unresponsive.
Seizures.

EXAM:
PORTABLE CHEST 1 VIEW

[Series 1: chest · 0.14mm/px · 2 of 2 slices shown]
[im 1/2]
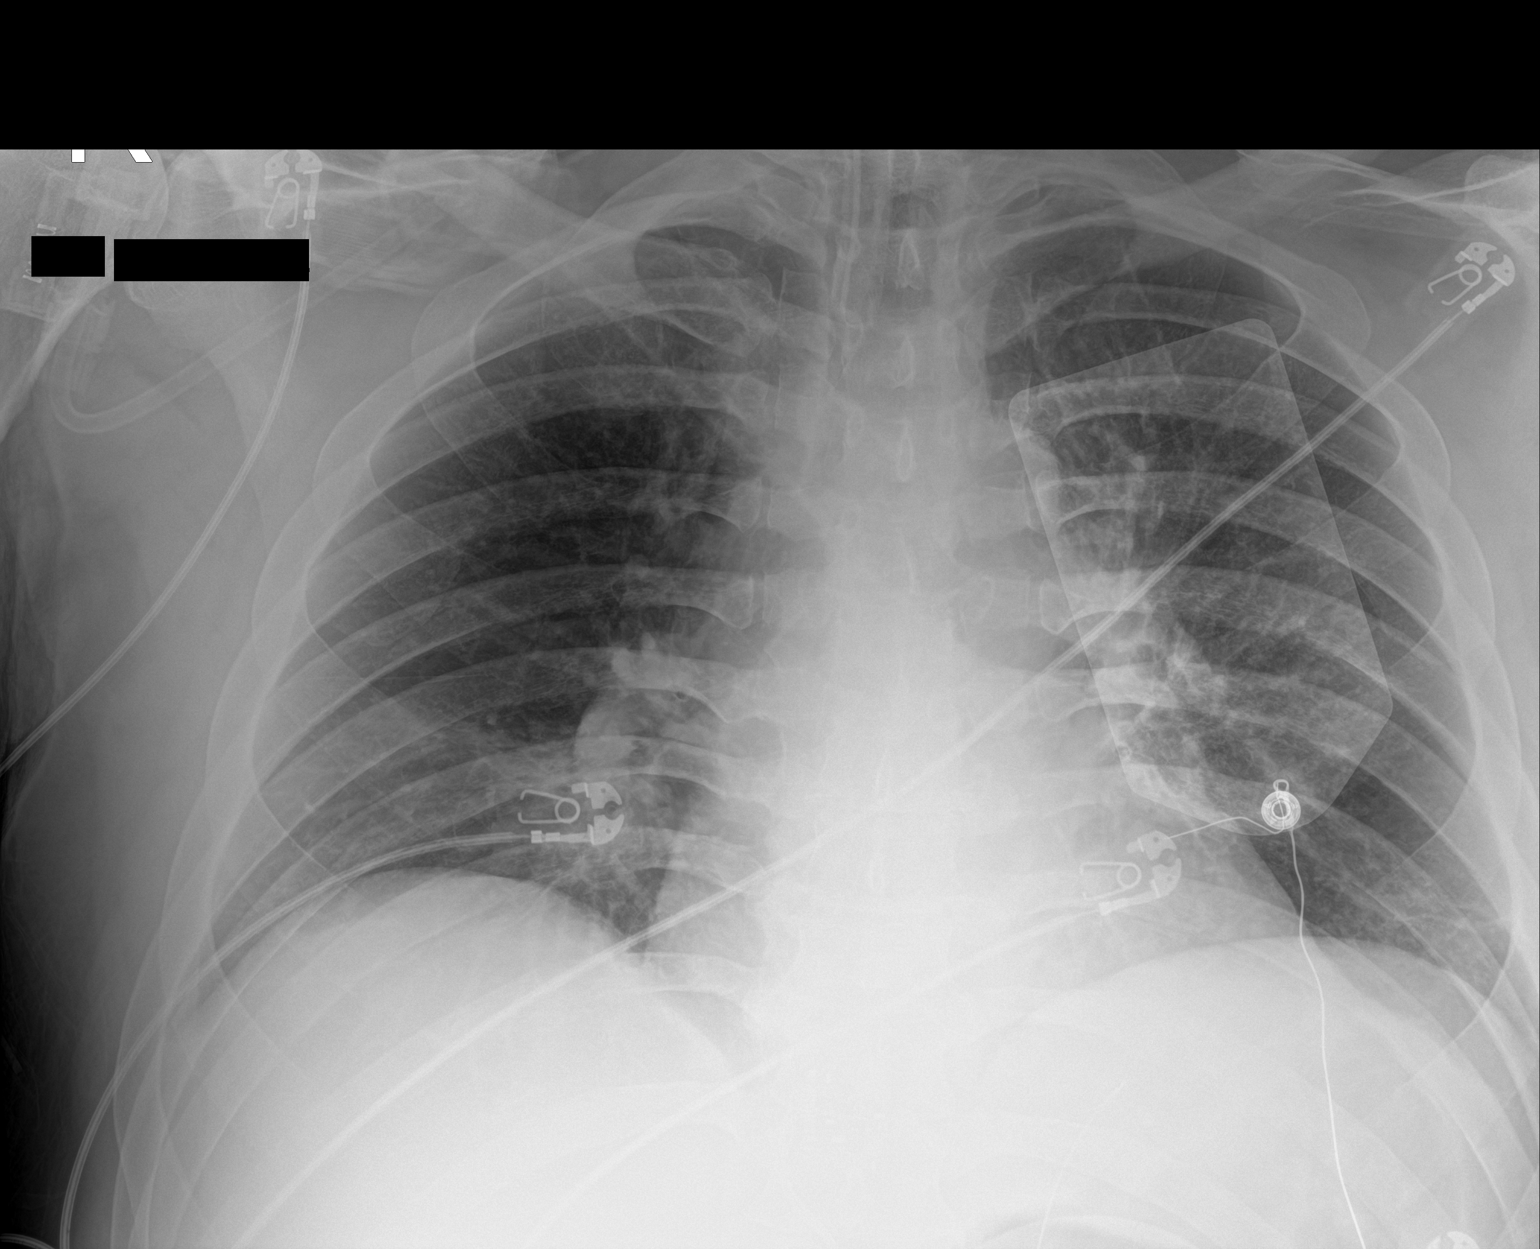
[im 2/2]
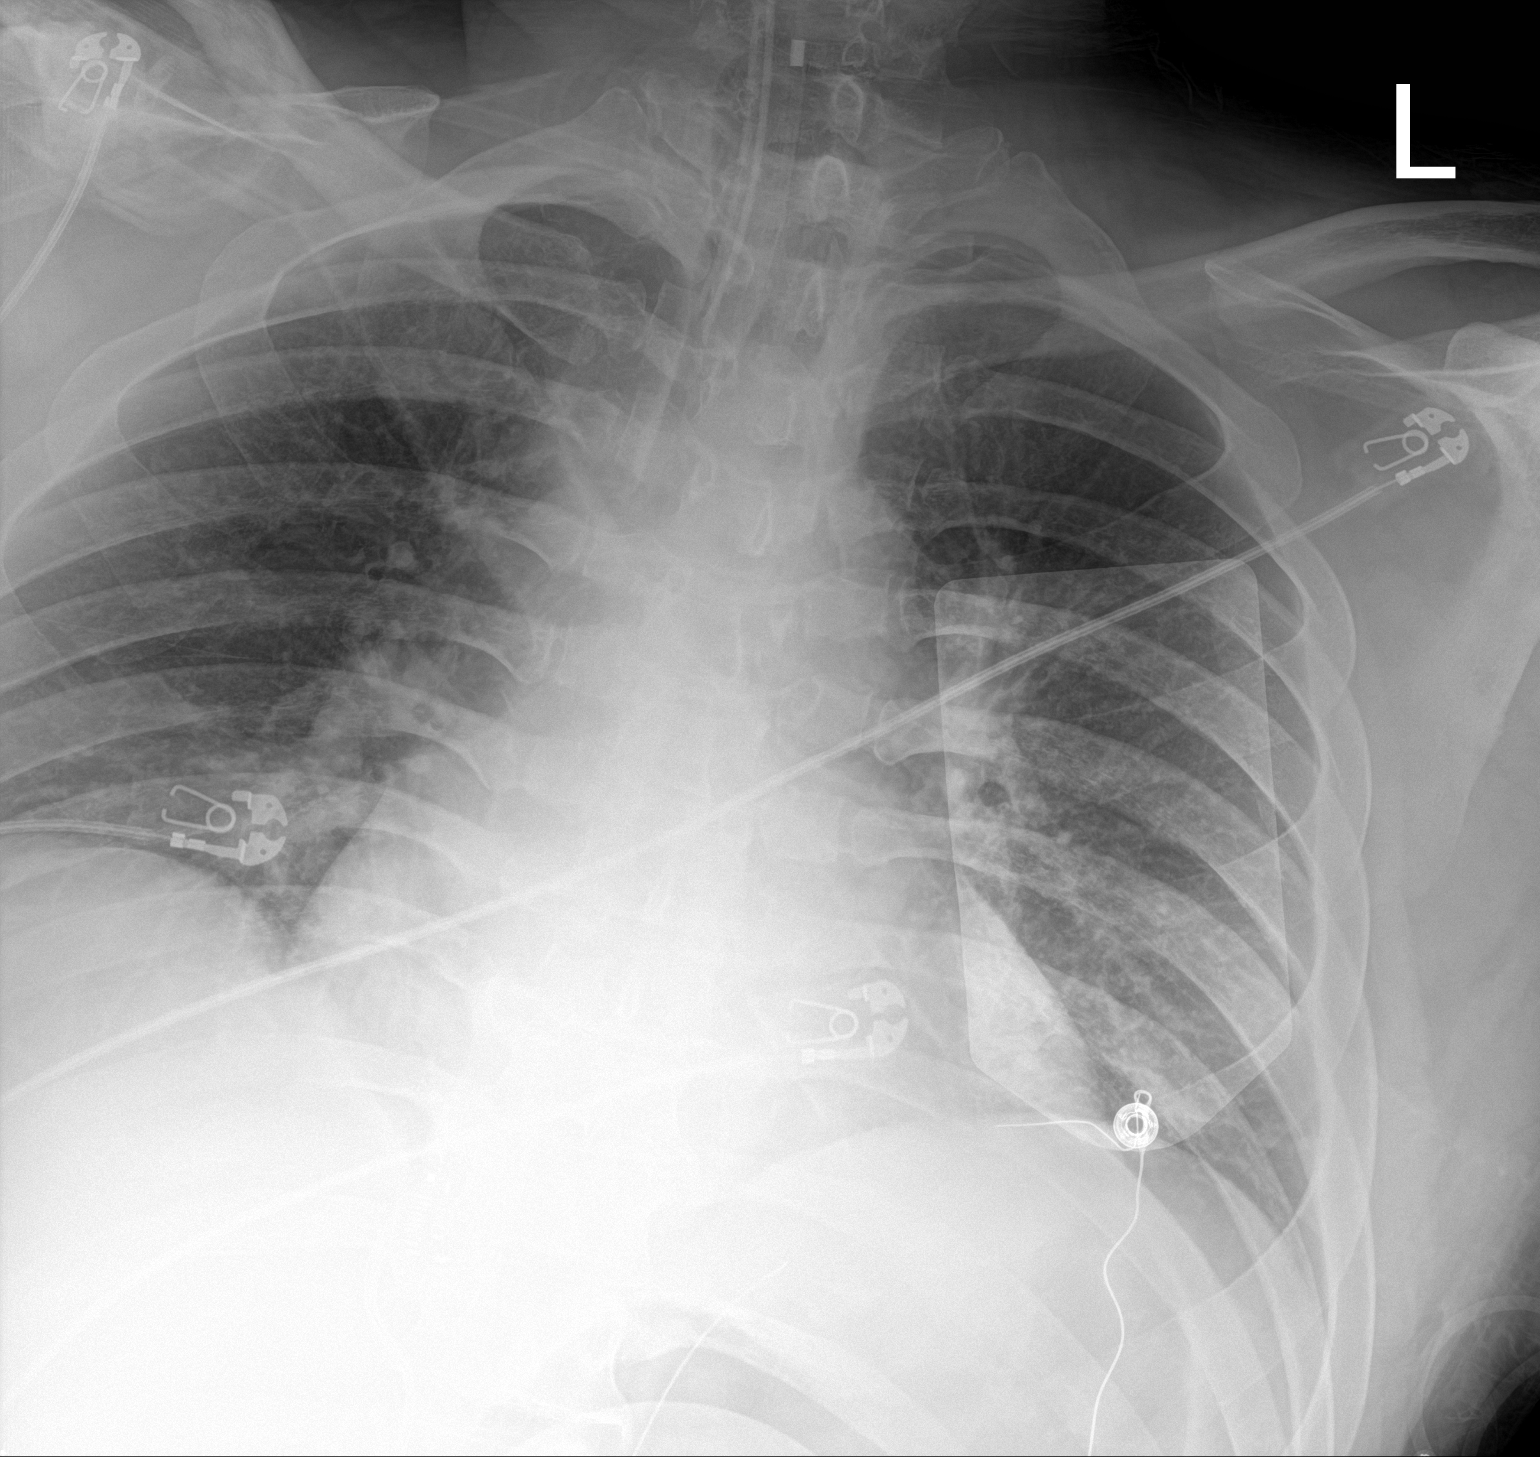

[2 of 2 positions shown; findings below may reference images not displayed]

FINDINGS: New endotracheal tube is in place with the tip at the level of the
clavicular heads, 6 cm above the carina. Defibrillator pad is noted.
Lungs are clear. Heart size is normal. No pneumothorax or pleural
fluid.
IMPRESSION: ET tube in good position.

Lungs clear.

## 2019-06-16 IMAGING — CT CT HEAD WO/W CM
3 of 6 series · 14 of 47 positions shown, 17 images · IV contrast (Omni 300)
Comparison: 11/04/2017

CLINICAL DATA: Altered level of consciousness, unexplained

EXAM:
CT HEAD WITHOUT AND WITH CONTRAST
TECHNIQUE: Contiguous axial images were obtained from the base of the skull
through the vertex without and with intravenous contrast
CONTRAST:  50mL 2DXKYK-DBN IOPAMIDOL (2DXKYK-DBN) INJECTION 76%

[Series 3: head with sag · sagittal · 0.37mm/px · 1 of 65 slices shown]
[im 43/65  brain]
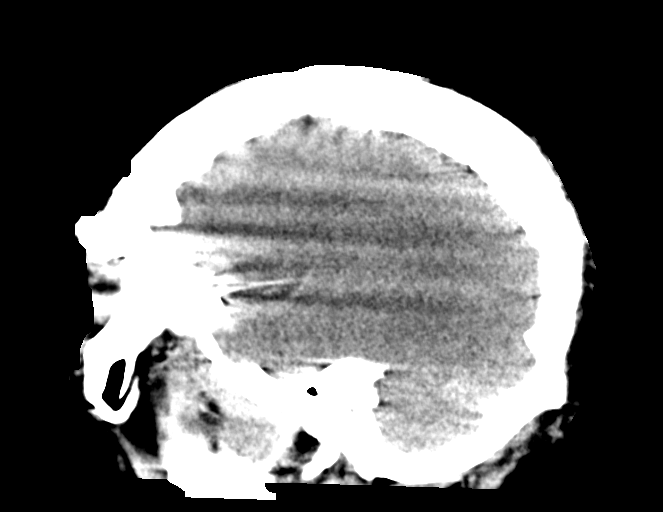

[Series 4: head with · axial · 0.44mm/px · z∈[-79,+81]mm · 10 of 38 slices shown, 13 images]
[im 3/38  brain]
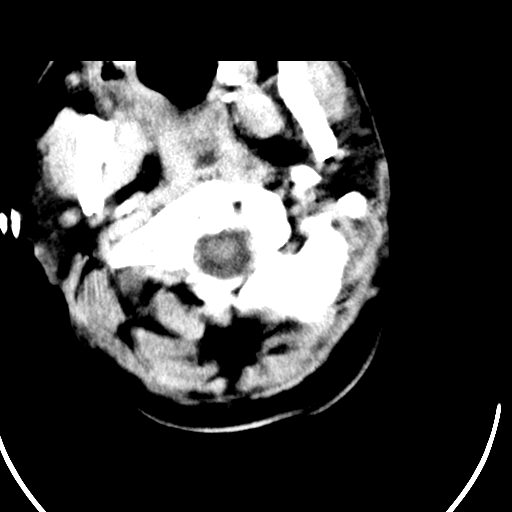
[im 3/38  bone]
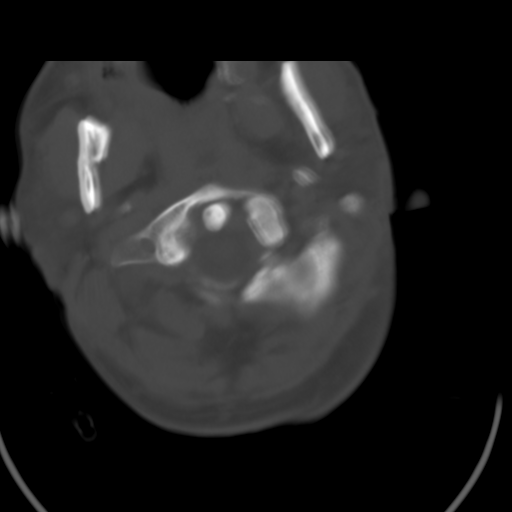
[im 7/38  brain]
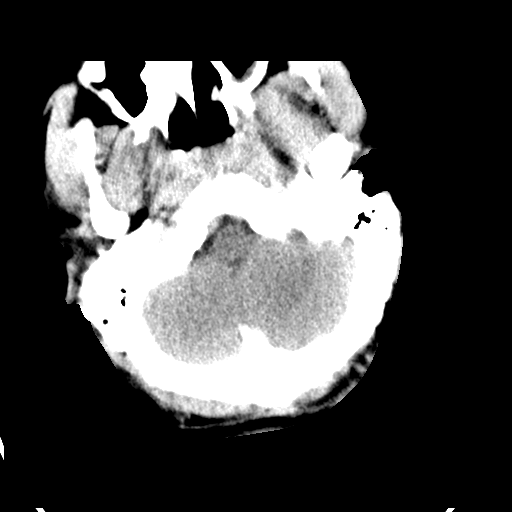
[im 11/38  brain]
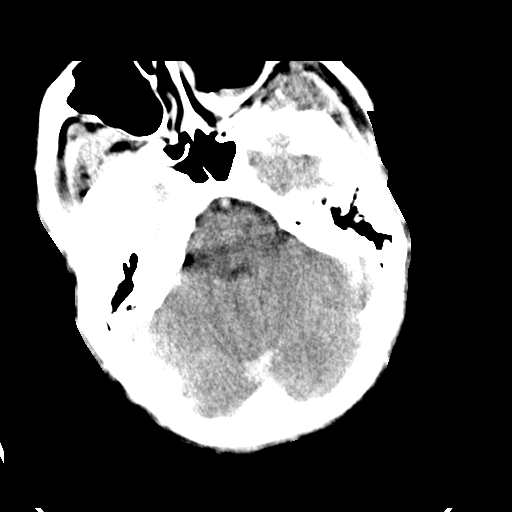
[im 13/38  brain]
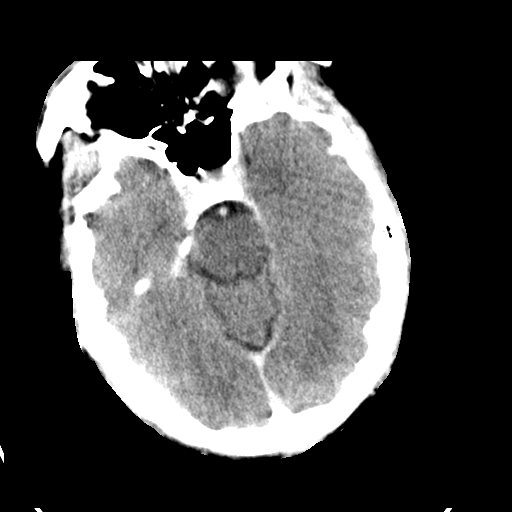
[im 17/38  brain]
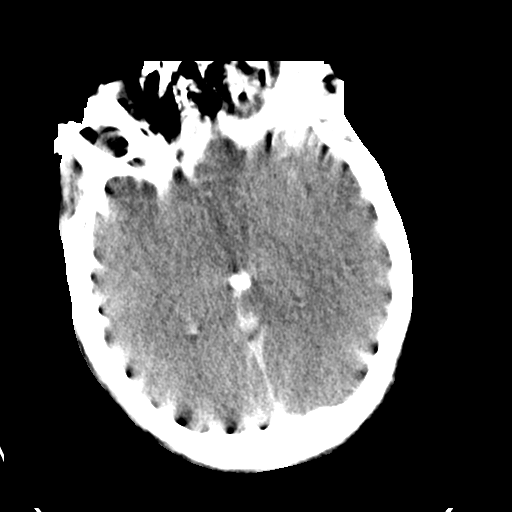
[im 17/38  bone]
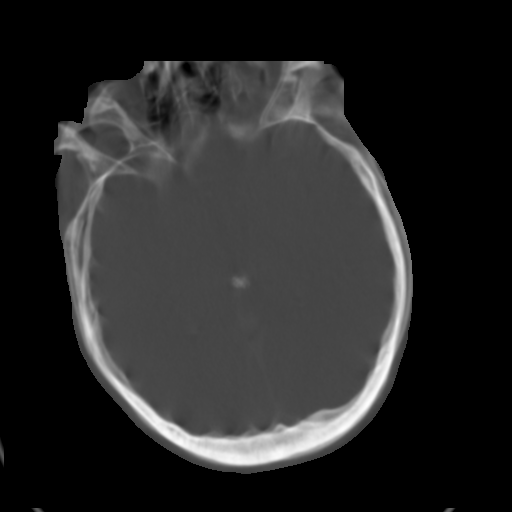
[im 21/38  brain]
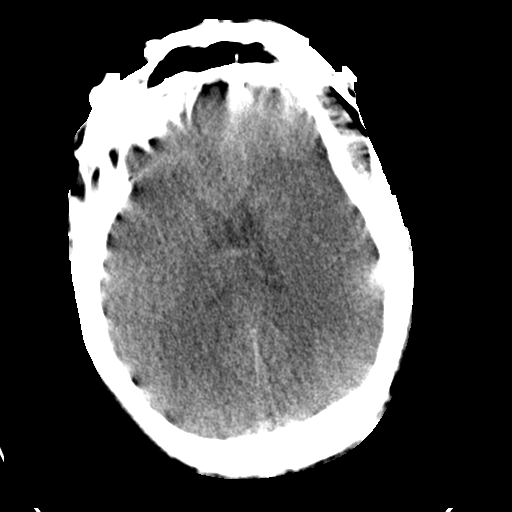
[im 25/38  brain]
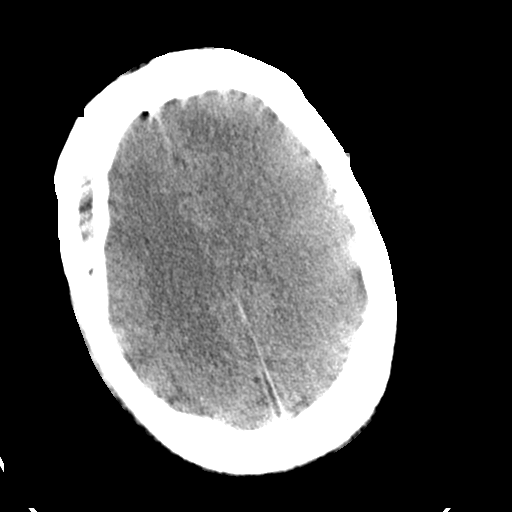
[im 27/38  brain]
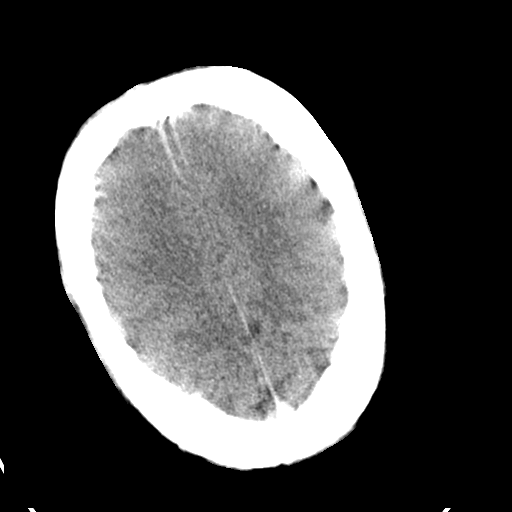
[im 31/38  brain]
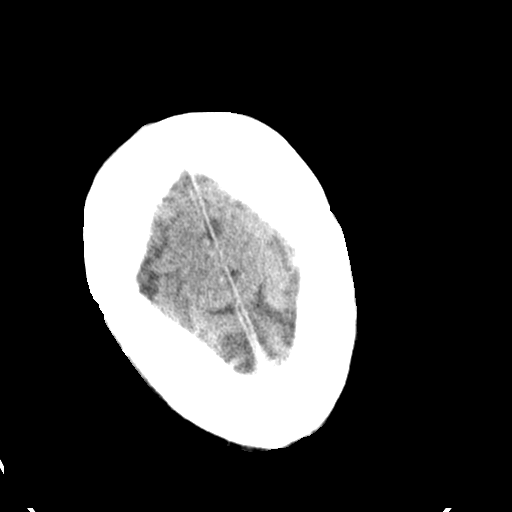
[im 31/38  bone]
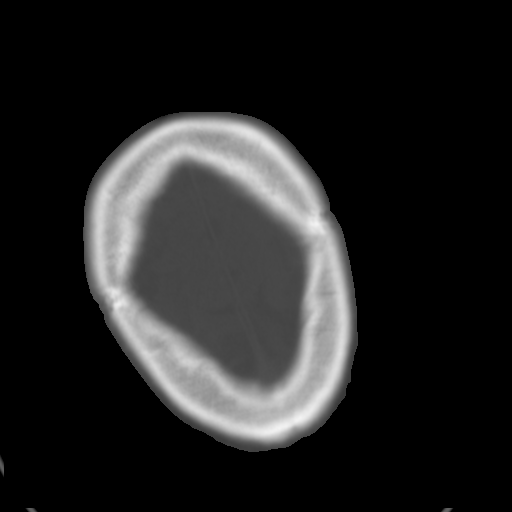
[im 35/38  brain]
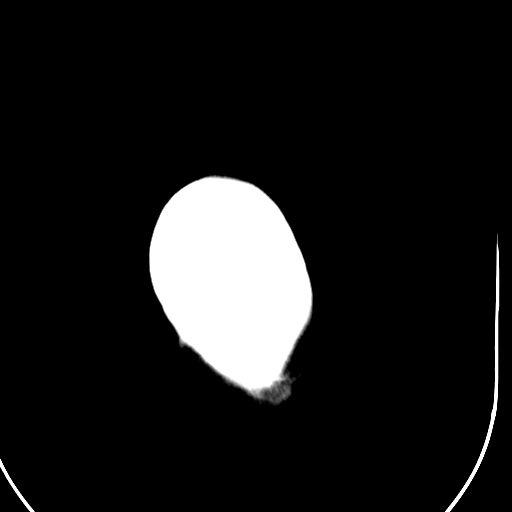

[Series 6: head with cor · coronal · 0.24mm/px · 3 of 73 slices shown]
[im 25/73  brain]
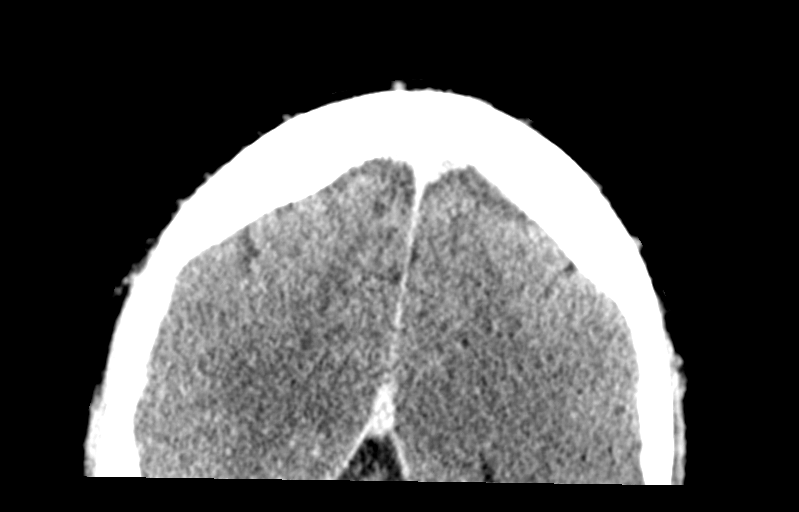
[im 37/73  brain]
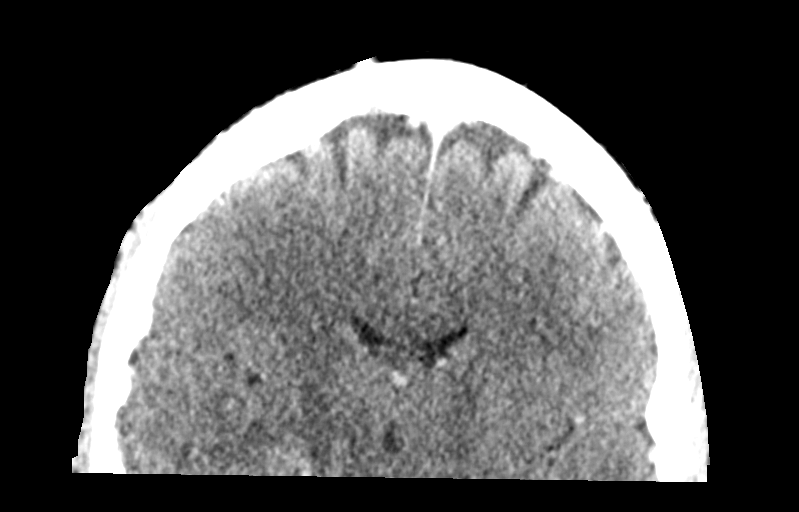
[im 49/73  brain]
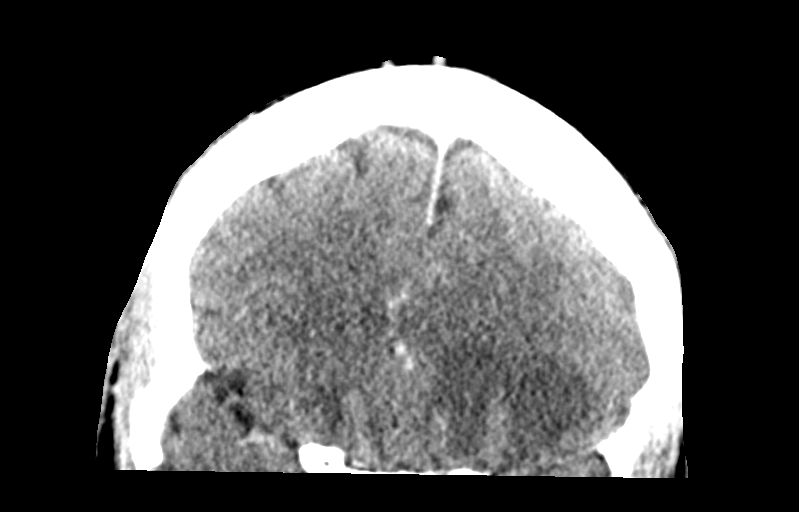

[14 of 47 positions shown; findings below may reference images not displayed]

FINDINGS: Brain: There are rim enhancing subfrontal collections on both sides
and in the midline. On the left the collection measures up to 3 cm
in diameter and 14 mm in thickness on the right the collection is
2.7 cm in diameter and 12 mm in thickness. The midline collection is
along the posterior planum sphenoidale, 2.7 cm in maximum diameter
by 7 mm in thickness. These are consistent with subdural empyemas in
this patient with complicated sinusitis seen recently. There is
marked edema in the bilateral inferior frontal lobes. No
hydrocephalus, shift, or herniation.

Vascular: Major vessels are enhancing

Sinuses/Orbits: Anterior nasal septum perforation. Changes of
endoscopic sinus surgery with improved drainage. There is still
active sinusitis, greatest on the left, with maxillary fluid level
and extensive frontal opacification. Significantly improved
extraconal abscess in the left superior orbit, with trace fluid
density seen on coronal reformats measuring only 1 mm in thickness..
Reportedly there was eye surgery last week at outside facility.

Other: Critical Value/emergent results were called by telephone at
the time of interpretation on 11/24/2017 at [DATE] to Dr. KORATH
MASATOYO , who verbally acknowledged these results.
IMPRESSION: 1. Three separate subdural empyemas in the anterior cranial fossa
located bilaterally and in the midline. These collections measure up
to 3 cm in diameter and 1.4 cm in thickness. Marked adjacent
vasogenic edema.
2. Improved extraconal abscess in the superior left orbit with
minimal fluid seen on coronal reformats.
3. Interval endoscopic sinus surgery but still active sinusitis

## 2019-06-18 IMAGING — DX DG CHEST 1V PORT
1 series · 1 of 1 positions shown · non-contrast
Comparison: November 25, 2017

CLINICAL DATA: Hypoxia

EXAM:
PORTABLE CHEST 1 VIEW

[chest]
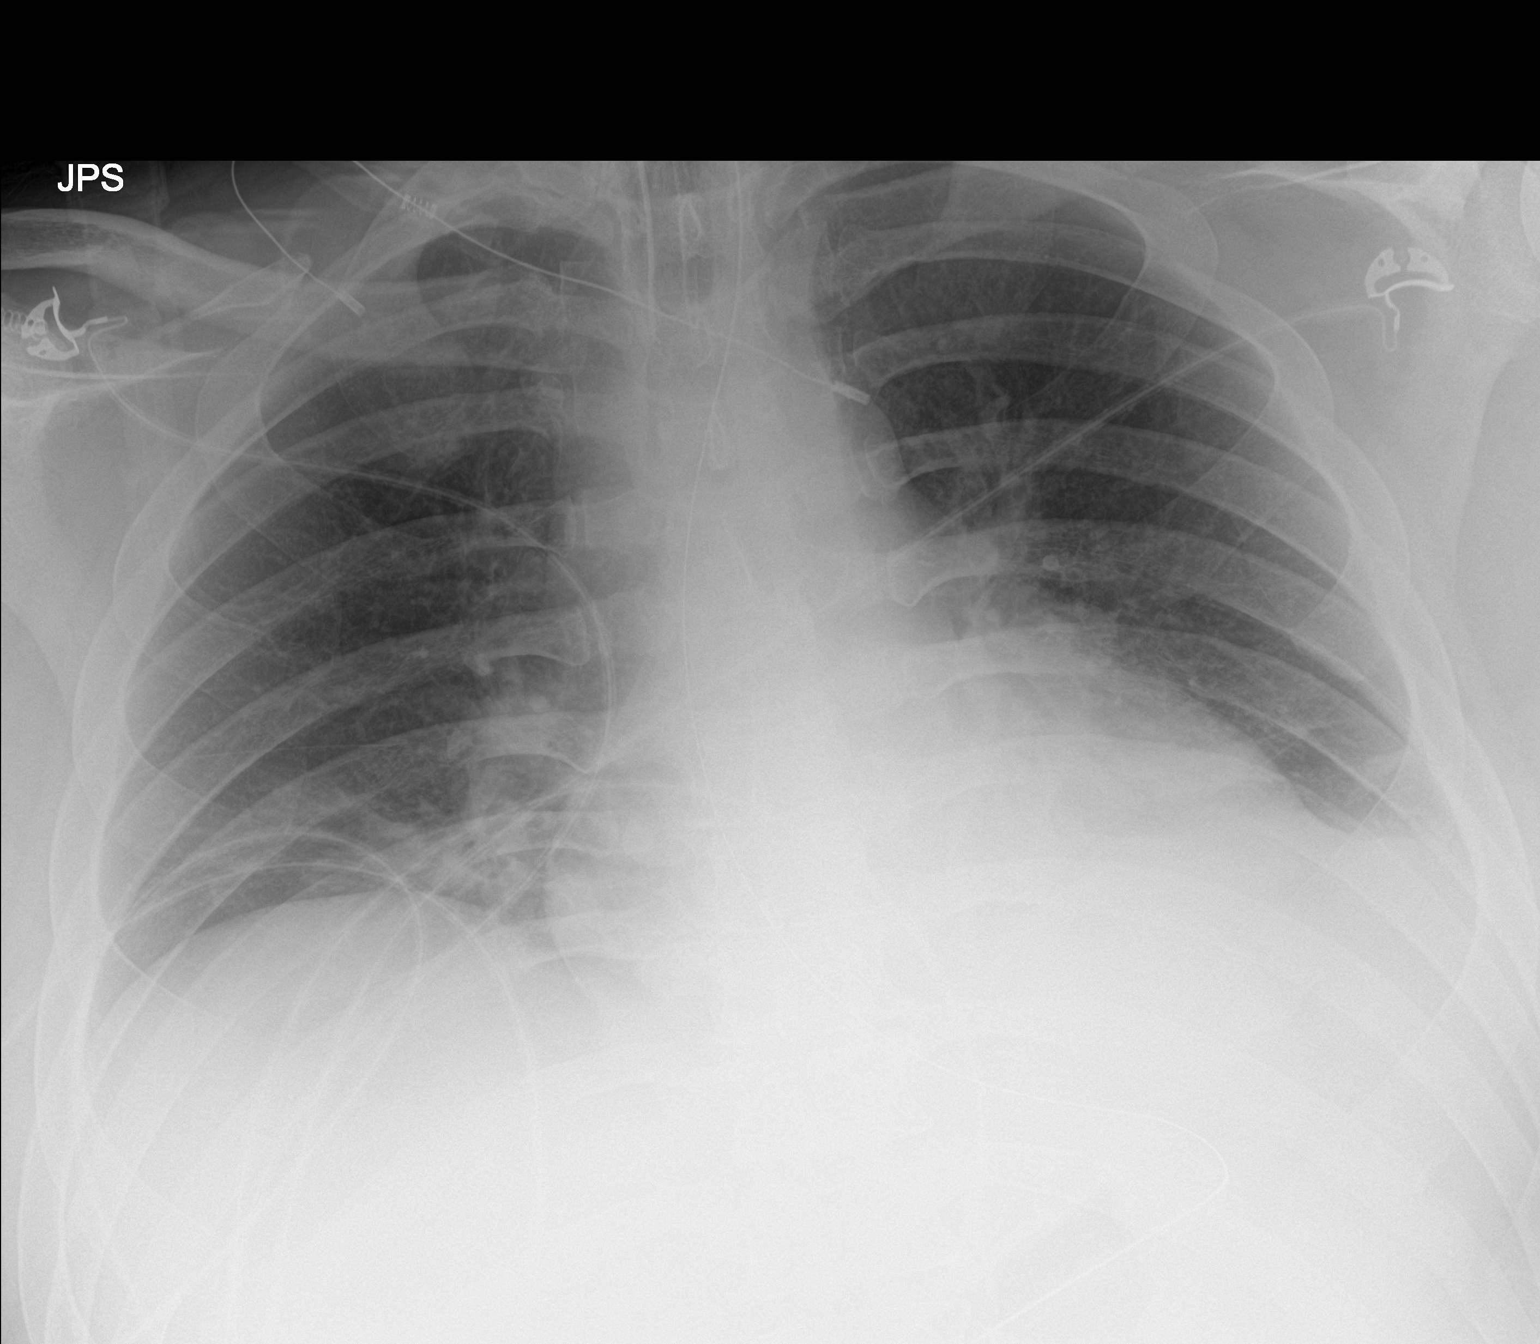

[1 of 1 positions shown; findings below may reference images not displayed]

FINDINGS: Endotracheal tube tip is 4.4 cm above the carina. Nasogastric tube
tip and side port are below the diaphragm. No pneumothorax. There is
a small left pleural effusion with atelectatic change in the left
base. The right lung is clear. Heart is borderline enlarged with
pulmonary vascularity normal. No adenopathy. No bone lesions.
IMPRESSION: Tube positions as described without pneumothorax. Small left pleural
effusion with left base atelectasis. Right lung clear. Stable
cardiac prominence.

## 2021-01-20 ENCOUNTER — Other Ambulatory Visit: Payer: Self-pay

## 2021-01-20 ENCOUNTER — Ambulatory Visit
Admission: EM | Admit: 2021-01-20 | Discharge: 2021-01-20 | Disposition: A | Discharge status: Home / Self Care | Payer: Self-pay
